# Patient Record
Sex: Female | Born: 1990 | Race: White | Hispanic: No | Marital: Single | Smoking: Current every day smoker
Health system: Southern US, Community
[De-identification: ages and names within clinical notes are randomized; demographics above are authoritative.]

## PROBLEM LIST (undated history)

## (undated) HISTORY — PX: OTHER SURGICAL HISTORY: SHX169

---

## 2013-04-10 ENCOUNTER — Ambulatory Visit: Payer: Self-pay

## 2013-04-17 ENCOUNTER — Ambulatory Visit: Payer: Self-pay | Attending: Internal Medicine | Admitting: Internal Medicine

## 2013-04-17 ENCOUNTER — Encounter: Payer: Self-pay | Admitting: Internal Medicine

## 2013-04-17 VITALS — BP 109/68 | HR 91 | Temp 98.3°F | Resp 17 | Wt 95.0 lb

## 2013-04-17 DIAGNOSIS — F17200 Nicotine dependence, unspecified, uncomplicated: Secondary | ICD-10-CM | POA: Insufficient documentation

## 2013-04-17 DIAGNOSIS — N63 Unspecified lump in unspecified breast: Secondary | ICD-10-CM | POA: Insufficient documentation

## 2013-04-17 DIAGNOSIS — M2550 Pain in unspecified joint: Secondary | ICD-10-CM | POA: Insufficient documentation

## 2013-04-17 DIAGNOSIS — Z139 Encounter for screening, unspecified: Secondary | ICD-10-CM

## 2013-04-17 DIAGNOSIS — N898 Other specified noninflammatory disorders of vagina: Secondary | ICD-10-CM

## 2013-04-17 LAB — CBC WITH DIFFERENTIAL/PLATELET
BASOS ABS: 0 10*3/uL (ref 0.0–0.1)
Basophils Relative: 1 % (ref 0–1)
EOS ABS: 0.3 10*3/uL (ref 0.0–0.7)
EOS PCT: 4 % (ref 0–5)
HCT: 40.9 % (ref 36.0–46.0)
Hemoglobin: 13.9 g/dL (ref 12.0–15.0)
LYMPHS ABS: 2.1 10*3/uL (ref 0.7–4.0)
Lymphocytes Relative: 34 % (ref 12–46)
MCH: 29 pg (ref 26.0–34.0)
MCHC: 34 g/dL (ref 30.0–36.0)
MCV: 85.4 fL (ref 78.0–100.0)
Monocytes Absolute: 0.4 10*3/uL (ref 0.1–1.0)
Monocytes Relative: 7 % (ref 3–12)
Neutro Abs: 3.4 10*3/uL (ref 1.7–7.7)
Neutrophils Relative %: 54 % (ref 43–77)
PLATELETS: 370 10*3/uL (ref 150–400)
RBC: 4.79 MIL/uL (ref 3.87–5.11)
RDW: 13.6 % (ref 11.5–15.5)
WBC: 6.2 10*3/uL (ref 4.0–10.5)

## 2013-04-17 LAB — LIPID PANEL
CHOLESTEROL: 148 mg/dL (ref 0–200)
HDL: 53 mg/dL (ref >39)
LDL Cholesterol: 82 mg/dL (ref 0–99)
Total CHOL/HDL Ratio: 2.8 Ratio
Triglycerides: 66 mg/dL (ref <150)
VLDL: 13 mg/dL (ref 0–40)

## 2013-04-17 LAB — COMPLETE METABOLIC PANEL WITH GFR
ALT: 9 U/L (ref 0–35)
AST: 13 U/L (ref 0–37)
Albumin: 4.4 g/dL (ref 3.5–5.2)
Alkaline Phosphatase: 78 U/L (ref 39–117)
BILIRUBIN TOTAL: 0.4 mg/dL (ref 0.3–1.2)
BUN: 7 mg/dL (ref 6–23)
CO2: 27 meq/L (ref 19–32)
CREATININE: 0.6 mg/dL (ref 0.50–1.10)
Calcium: 9.3 mg/dL (ref 8.4–10.5)
Chloride: 104 mEq/L (ref 96–112)
GFR, Est Non African American: >89 mL/min
Glucose, Bld: 69 mg/dL — ABNORMAL LOW (ref 70–99)
Potassium: 4.5 mEq/L (ref 3.5–5.3)
Sodium: 139 mEq/L (ref 135–145)
Total Protein: 6.9 g/dL (ref 6.0–8.3)

## 2013-04-17 MED ORDER — FLUCONAZOLE 150 MG PO TABS: 150.0000 mg | ORAL_TABLET | Freq: Once | ORAL | Status: DC

## 2013-04-17 MED ORDER — METRONIDAZOLE 500 MG PO TABS: 500.0000 mg | ORAL_TABLET | Freq: Three times a day (TID) | ORAL | Status: DC

## 2013-04-17 MED ORDER — NAPROXEN 500 MG PO TABS: 500.0000 mg | ORAL_TABLET | Freq: Two times a day (BID) | ORAL | Status: DC

## 2013-04-17 NOTE — Progress Notes (Signed)
Patient complains of lump in left breast Also had a lump in her groin on the right side which is bothersome to her

## 2013-04-17 NOTE — Progress Notes (Signed)
Patient Demographics  Judy Pham, is a 23 y.o. female  ZOX:[id number]  [id number]  DOB - Apr 26, 1990  CC:  Chief Complaint  Patient presents with  . Breast Mass       HPI: Judy Pham is a 23 y.o. female here today to establish medical care. Patient reported to have noticed lump her in her left breast for the last her 3 years as per patient it is slightly increasing in size denies any pain or any nipple discharge, denies any family history of cancer. Patient also reported to have lot of joint pain especially both knees right shoulder on and off. Patient also reported to have vaginal discharge for the last 2 weeks and questionable noticed lump in her right groin denies any rash. Patient has No headache, No chest pain, No abdominal pain - No Nausea, No new weakness tingling or numbness, No Cough - SOB.  No Known Allergies History reviewed. No pertinent past medical history. No current outpatient prescriptions on file prior to visit.   No current facility-administered medications on file prior to visit.   Family History  Problem Relation Age of Onset  . Hypertension Father   . Cancer Paternal Grandfather     skin cancer    History   Social History  . Marital Status: Single    Spouse Name: N/A    Number of Children: N/A  . Years of Education: N/A   Occupational History  . Not on file.   Social History Main Topics  . Smoking status: Light Tobacco Smoker -- 0.25 packs/day for 5 years  . Smokeless tobacco: Not on file     Comment: about 5 cigarettes a day  . Alcohol Use: No  . Drug Use: Not on file  . Sexual Activity: Not on file   Other Topics Concern  . Not on file   Social History Narrative  . No narrative on file    Review of Systems: Constitutional: Negative for fever, chills, diaphoresis, activity change, appetite change and fatigue. HENT: Negative for ear pain, nosebleeds, congestion, facial swelling, rhinorrhea, neck pain, neck stiffness and ear  discharge.  Eyes: Negative for pain, discharge, redness, itching and visual disturbance. Respiratory: Negative for cough, choking, chest tightness, shortness of breath, wheezing and stridor.  Cardiovascular: Negative for chest pain, palpitations and leg swelling. Gastrointestinal: Negative for abdominal distention. Genitourinary: Negative for dysuria, urgency, frequency, hematuria, flank pain, decreased urine volume, difficulty urinating and dyspareunia. + vaginal discharge. Musculoskeletal: Negative for back pain, joint swelling, +arthralgia  Knee pain Neurological: Negative for dizziness, tremors, seizures, syncope, facial asymmetry, speech difficulty, weakness, light-headedness, numbness and headaches.  Psychiatric/Behavioral: Negative for hallucinations, behavioral problems, confusion, dysphoric mood, decreased concentration and agitation.    Objective:   Filed Vitals:   04/17/13 1511  BP: 109/68  Pulse: 91  Temp: 98.3 F (36.8 C)  Resp: 17    Physical Exam: Constitutional: Patient appears well-developed and well-nourished. No distress. HENT: Normocephalic, atraumatic, External right and left ear normal. Oropharynx is clear and moist.  Eyes: Conjunctivae and EOM are normal. PERRLA, no scleral icterus. Neck: Normal ROM. Neck supple. No JVD. No tracheal deviation. No thyromegaly. CVS: RRR, S1/S2 +, no murmurs, no gallops, no carotid bruit.  Pulmonary: Effort and breath sounds normal, no stridor, rhonchi, wheezes, rales.  Breast examination done in the presence of medical staff as chaperone, or palpable lump in the left breast above the nipple medially, nontender. Abdominal: Soft. BS +, no distension, tenderness, rebound or guarding.  Musculoskeletal: Normal range  of motion. No edema and no tenderness.  Lymphadenopathy: No lymphadenopathy noted, in the inguinal area.  Neuro: Alert. Normal reflexes, muscle tone coordination. No cranial nerve deficit. Skin: Skin is warm and dry. No  rash noted. Not diaphoretic. No erythema. No pallor. Psychiatric: Normal mood and affect. Behavior, judgment, thought content normal.  No results found for this basename: WBC, HGB, HCT, MCV, PLT   No results found for this basename: CREATININE, BUN, NA, K, CL, CO2    No results found for this basename: HGBA1C   Lipid Panel  No results found for this basename: chol, trig, hdl, cholhdl, vldl, ldlcalc       Assessment and plan:   1. Breast lump in female  - US Breast Bilateral; Future  2. Smoking  Advised to quit smoking.  3. Joint pain  - naproxen (NAPROSYN) 500 MG tablet; Take 1 tablet (500 mg total) by mouth 2 (two) times daily with a meal.  Dispense: 30 tablet; Refill: 2  4. Vaginal discharge  - metroNIDAZOLE (FLAGYL) 500 MG tablet; Take 1 tablet (500 mg total) by mouth 3 (three) times daily.  Dispense: 21 tablet; Refill: 0 - fluconazole (DIFLUCAN) 150 MG tablet; Take 1 tablet (150 mg total) by mouth once.  Dispense: 1 tablet; Refill: 0 - Ambulatory referral to Gynecology  5. Screening  - CBC with Differential - COMPLETE METABOLIC PANEL WITH GFR - TSH - Lipid panel - Vit D  25 hydroxy (rtn osteoporosis monitoring) - Ambulatory referral to Gynecology     Health Maintenance  -Pap Smear: referral done    Return in about 6 weeks (around 05/29/2013).    Doris CheadleADVANI, Srah Ake, MD

## 2013-04-18 LAB — VITAMIN D 25 HYDROXY (VIT D DEFICIENCY, FRACTURES): Vit D, 25-Hydroxy: 21 ng/mL — ABNORMAL LOW (ref 30–89)

## 2013-04-18 LAB — TSH: TSH: 0.651 u[IU]/mL (ref 0.350–4.500)

## 2013-04-20 ENCOUNTER — Telehealth: Payer: Self-pay

## 2013-04-20 MED ORDER — VITAMIN D (ERGOCALCIFEROL) 1.25 MG (50000 UNIT) PO CAPS: 50000.0000 [IU] | ORAL_CAPSULE | ORAL | Status: DC

## 2013-04-20 NOTE — Telephone Encounter (Signed)
Message copied by Lestine MountJUAREZ, Cordae Mccarey L on Mon Apr 20, 2013  2:46 PM ------      Message from: Doris CheadleADVANI, DEEPAK      Created: Mon Apr 20, 2013 11:41 AM       Blood work reviewed, noticed low vitamin D, call patient advise to start ergocalciferol 50,000 units once a week for the duration of  12 weeks.       ------

## 2013-04-20 NOTE — Telephone Encounter (Signed)
Patient returned call Lab results given Prescription sent to [facility]

## 2013-04-20 NOTE — Telephone Encounter (Signed)
Left message on machine to return our call. 

## 2013-05-04 ENCOUNTER — Encounter (HOSPITAL_COMMUNITY): Payer: Self-pay | Admitting: Emergency Medicine

## 2013-05-04 ENCOUNTER — Emergency Department (HOSPITAL_COMMUNITY)
Admission: EM | Admit: 2013-05-04 | Discharge: 2013-05-04 | Disposition: A | Discharge status: Home / Self Care | Payer: Self-pay | Attending: Emergency Medicine | Admitting: Emergency Medicine

## 2013-05-04 DIAGNOSIS — L089 Local infection of the skin and subcutaneous tissue, unspecified: Secondary | ICD-10-CM | POA: Insufficient documentation

## 2013-05-04 DIAGNOSIS — Z79899 Other long term (current) drug therapy: Secondary | ICD-10-CM | POA: Insufficient documentation

## 2013-05-04 DIAGNOSIS — Y939 Activity, unspecified: Secondary | ICD-10-CM | POA: Insufficient documentation

## 2013-05-04 DIAGNOSIS — R61 Generalized hyperhidrosis: Secondary | ICD-10-CM | POA: Insufficient documentation

## 2013-05-04 DIAGNOSIS — F17200 Nicotine dependence, unspecified, uncomplicated: Secondary | ICD-10-CM | POA: Insufficient documentation

## 2013-05-04 DIAGNOSIS — R509 Fever, unspecified: Secondary | ICD-10-CM | POA: Insufficient documentation

## 2013-05-04 DIAGNOSIS — Y929 Unspecified place or not applicable: Secondary | ICD-10-CM | POA: Insufficient documentation

## 2013-05-04 DIAGNOSIS — Z792 Long term (current) use of antibiotics: Secondary | ICD-10-CM | POA: Insufficient documentation

## 2013-05-04 MED ORDER — HYDROCODONE-ACETAMINOPHEN 5-325 MG PO TABS: 1.0000 | ORAL_TABLET | ORAL | Status: DC | PRN

## 2013-05-04 MED ORDER — HYDROCODONE-ACETAMINOPHEN 5-325 MG PO TABS
2.0000 | ORAL_TABLET | Freq: Once | ORAL | Status: AC
  Administered 2013-05-04: 2 via ORAL
  Filled 2013-05-04: qty 2

## 2013-05-04 MED ORDER — DOXYCYCLINE HYCLATE 100 MG PO TABS
100.0000 mg | ORAL_TABLET | Freq: Once | ORAL | Status: AC
  Administered 2013-05-04: 100 mg via ORAL
  Filled 2013-05-04: qty 1

## 2013-05-04 MED ORDER — DOXYCYCLINE HYCLATE 100 MG PO CAPS: 100.0000 mg | ORAL_CAPSULE | Freq: Two times a day (BID) | ORAL | Status: DC

## 2013-05-04 NOTE — ED Provider Notes (Signed)
CSN: [id number]     Arrival date & time 05/04/13  1541 History  This chart was scribed for Emilia Beck, PA working with Gwyneth Sprout, MD by Quintella Reichert, ED Scribe. This patient was seen in room TR06C/TR06C and the patient's care was started at 5:57 PM.   Chief Complaint  Patient presents with  . Insect Bite    The history is provided by the patient. No language interpreter was used.    HPI Comments: Judy Pham is a 23 y.o. female who presents to the Emergency Department complaining of painful insect bites to bilateral legs that she first noticed 4 days ago.  Pt states that although the bites "don't look that bad" they are severely painful to even light palpation.  She also notes that several nights ago she woke up with night sweats.  She denies any other fever.  ED temperature is 98.1 F.  Pt is unsure what kind of insect bit her.  She denies being in the woods recently.  She denies recent exposure to similar symptoms.  She notes that she was recently placed on Flagyll for a separate issue and finished today.     History reviewed. No pertinent past medical history.   Past Surgical History  Procedure Laterality Date  . Right hand surgery       Family History  Problem Relation Age of Onset  . Hypertension Father   . Cancer Paternal Grandfather     skin cancer     History  Substance Use Topics  . Smoking status: Light Tobacco Smoker -- 0.25 packs/day for 5 years  . Smokeless tobacco: Not on file     Comment: about 5 cigarettes a day  . Alcohol Use: No    OB History   Grav Para Term Preterm Abortions TAB SAB Ect Mult Living                  Review of Systems  Constitutional: Negative for fever.       Night sweats  Skin: Positive for wound (insect bites).  All other systems reviewed and are negative.     Allergies  Review of patient's allergies indicates no known allergies.  Home Medications   Current Outpatient Rx  Name  Route  Sig  Dispense   Refill  . Vitamin D, Ergocalciferol, (DRISDOL) 50000 UNITS CAPS capsule   Oral   Take 1 capsule (50,000 Units total) by mouth every 7 (seven) days.   12 capsule   0   . metroNIDAZOLE (FLAGYL) 500 MG tablet   Oral   Take 1 tablet (500 mg total) by mouth 3 (three) times daily.   21 tablet   0    BP 124/75  Pulse 109  Temp(Src) 98.1 F (36.7 C) (Oral)  Resp 16  Ht 4\' 9"  (1.448 m)  Wt 89 lb (40.37 kg)  BMI 19.25 kg/m2  SpO2 96%  Physical Exam  Nursing note and vitals reviewed. Constitutional: She is oriented to person, place, and time. She appears well-developed and well-nourished. No distress.  HENT:  Head: Normocephalic and atraumatic.  Eyes: EOM are normal.  Neck: Neck supple. No tracheal deviation present.  Cardiovascular: Normal rate.   Pulmonary/Chest: Effort normal. No respiratory distress.  Musculoskeletal: Normal range of motion.  Neurological: She is alert and oriented to person, place, and time.  Skin: Skin is warm and dry.  3x3-cm area of erythema and induration, with central "bite" mark, on right lateral thigh.  Similar lesions on right lower  leg and left thigh and anterior tibia.  The areas are tender to palpate.  No open wounds.  Psychiatric: She has a normal mood and affect. Her behavior is normal.    ED Course  Procedures (including critical care time)  DIAGNOSTIC STUDIES: Oxygen Saturation is 96% on room air, normal by my interpretation.    COORDINATION OF CARE: 6:01 PM-Discussed treatment plan which includes pain medication and antibiotics for likely infection with pt at bedside and pt agreed to plan.     Labs Review Labs Reviewed - No data to display  Imaging Review No results found.  EKG Interpretation   None       MDM   Final diagnoses:  None  1. Skin infection  6:05 PM Patient likely has infected bug bites. Patient will have doxycycline and vicodin for symptoms. Patient advised to follow up with PCP for further evaluation.  Vitals stable and patient afebrile.    I personally performed the services described in this documentation, which was scribed in my presence. The recorded information has been reviewed and is accurate.     Emilia BeckKaitlyn Terell Kincy, [place] JerseyPA-C 05/04/13 1806

## 2013-05-04 NOTE — Discharge Instructions (Signed)
Take doxycycline as directed until gone. Take vicodin as needed for pain. Follow up with your doctor to make sure your symptoms are improving.

## 2013-05-04 NOTE — ED Notes (Signed)
Pt noticed insect bites to lower legs 4 days ago.  Pt has been scratching, so scabs have formed on bites.  No drainage noted.  Pt has been taking antibiotics for bv, so she thought those antibiotics would help the bites, but they have not.

## 2013-05-05 ENCOUNTER — Emergency Department (HOSPITAL_COMMUNITY)
Admission: EM | Admit: 2013-05-05 | Discharge: 2013-05-06 | Discharge status: Against Medical Advice | Payer: Self-pay | Attending: Emergency Medicine | Admitting: Emergency Medicine

## 2013-05-05 ENCOUNTER — Encounter (HOSPITAL_COMMUNITY): Payer: Self-pay | Admitting: Emergency Medicine

## 2013-05-05 DIAGNOSIS — F17200 Nicotine dependence, unspecified, uncomplicated: Secondary | ICD-10-CM | POA: Insufficient documentation

## 2013-05-05 DIAGNOSIS — Y929 Unspecified place or not applicable: Secondary | ICD-10-CM | POA: Insufficient documentation

## 2013-05-05 DIAGNOSIS — Y939 Activity, unspecified: Secondary | ICD-10-CM | POA: Insufficient documentation

## 2013-05-05 DIAGNOSIS — R109 Unspecified abdominal pain: Secondary | ICD-10-CM | POA: Insufficient documentation

## 2013-05-05 DIAGNOSIS — Z792 Long term (current) use of antibiotics: Secondary | ICD-10-CM | POA: Insufficient documentation

## 2013-05-05 DIAGNOSIS — S90569A Insect bite (nonvenomous), unspecified ankle, initial encounter: Secondary | ICD-10-CM | POA: Insufficient documentation

## 2013-05-05 DIAGNOSIS — Z3202 Encounter for pregnancy test, result negative: Secondary | ICD-10-CM | POA: Insufficient documentation

## 2013-05-05 DIAGNOSIS — W57XXXA Bitten or stung by nonvenomous insect and other nonvenomous arthropods, initial encounter: Secondary | ICD-10-CM

## 2013-05-05 DIAGNOSIS — S80869A Insect bite (nonvenomous), unspecified lower leg, initial encounter: Secondary | ICD-10-CM

## 2013-05-05 NOTE — ED Provider Notes (Signed)
Medical screening examination/treatment/procedure(s) were performed by non-physician practitioner and as supervising physician I was immediately available for consultation/collaboration.  EKG Interpretation   None         Gwyneth SproutWhitney Reade Trefz, MD 05/05/13 0040

## 2013-05-05 NOTE — ED Notes (Signed)
Pt reports spider bites to both legs a few days ago. Pt states bites are very painful and itchy. Pt worried that they might be from [facility]. Pt also started having cramping pain to lower abdomen. Pt states went to hospital yesterday and was given antibiotics.

## 2013-05-06 LAB — URINALYSIS, ROUTINE W REFLEX MICROSCOPIC
Bilirubin Urine: NEGATIVE
Glucose, UA: NEGATIVE mg/dL
Hgb urine dipstick: NEGATIVE
Ketones, ur: NEGATIVE mg/dL
NITRITE: NEGATIVE
Protein, ur: NEGATIVE mg/dL
SPECIFIC GRAVITY, URINE: 1.013 (ref 1.005–1.030)
UROBILINOGEN UA: 0.2 mg/dL (ref 0.0–1.0)
pH: 7 (ref 5.0–8.0)

## 2013-05-06 LAB — URINE MICROSCOPIC-ADD ON

## 2013-05-06 LAB — POCT PREGNANCY, URINE: PREG TEST UR: NEGATIVE

## 2013-05-06 MED ORDER — OXYCODONE-ACETAMINOPHEN 5-325 MG PO TABS: 2.0000 | ORAL_TABLET | Freq: Once | ORAL | Status: DC

## 2013-05-06 MED ORDER — TETANUS-DIPHTH-ACELL PERTUSSIS 5-2.5-18.5 LF-MCG/0.5 IM SUSP: 0.5000 mL | Freq: Once | INTRAMUSCULAR | Status: DC

## 2013-05-06 NOTE — ED Provider Notes (Signed)
CSN: [id number]     Arrival date & time 05/05/13  2210 History   First MD Initiated Contact with Patient 05/06/13 0009     Chief Complaint  Patient presents with  . Insect Bite  . Abdominal Pain     (Consider location/radiation/quality/duration/timing/severity/associated sxs/prior Treatment) HPI Comments: Patient is a 23 year old female who presents to the emergency department for suppose it spider bites to bilateral legs with onset a few days ago. Patient states that bites are painful and itchy. She states they have been worsening over time. [facility] at which time she was prescribed doxycycline. Patient states she has taken 2 doses of this medication but has seen no improvement. Patient is concerned that bites may be from a black widow spider as she began having cramping pain in her lower abdomen. Patient states that this has been constant. She has not taken anything for her pain. She denies associated fever, chest pain or shortness of breath, nausea or vomiting, dysuria or hematuria, vaginal bleeding or discharge, numbness/tingling, and weakness.  Patient is a 23 y.o. female presenting with abdominal pain. The history is provided by the patient. No language interpreter was used.  Abdominal Pain Associated symptoms: no chest pain, no fever, no nausea, no shortness of breath and no vomiting     History reviewed. No pertinent past medical history. Past Surgical History  Procedure Laterality Date  . Right hand surgery      Family History  Problem Relation Age of Onset  . Hypertension Father   . Cancer Paternal Grandfather     skin cancer    History  Substance Use Topics  . Smoking status: Light Tobacco Smoker -- 0.25 packs/day for 5 years  . Smokeless tobacco: Not on file     Comment: about 5 cigarettes a day  . Alcohol Use: No   OB History   Grav Para Term Preterm Abortions TAB SAB Ect Mult Living                 Review  of Systems  Constitutional: Negative for fever.  Respiratory: Negative for shortness of breath.   Cardiovascular: Negative for chest pain.  Gastrointestinal: Positive for abdominal pain. Negative for nausea and vomiting.  Skin: Positive for color change.  All other systems reviewed and are negative.    Allergies  Review of patient's allergies indicates no known allergies.  Home Medications   Current Outpatient Rx  Name  Route  Sig  Dispense  Refill  . doxycycline (VIBRAMYCIN) 100 MG capsule   Oral   Take 1 capsule (100 mg total) by mouth 2 (two) times daily.   20 capsule   0   . Vitamin D, Ergocalciferol, (DRISDOL) 50000 UNITS CAPS capsule   Oral   Take 1 capsule (50,000 Units total) by mouth every 7 (seven) days.   12 capsule   0    BP 124/83  Pulse 112  Temp(Src) 98.2 F (36.8 C) (Oral)  Resp 18  Ht 4\' 10"  (1.473 m)  Wt 95 lb (43.092 kg)  BMI 19.86 kg/m2  SpO2 100%  LMP 04/22/2013  Physical Exam  Nursing note and vitals reviewed. Constitutional: She is oriented to person, place, and time. She appears well-developed and well-nourished. No distress.  HENT:  Head: Normocephalic and atraumatic.  Eyes: Conjunctivae and EOM are normal. Pupils are equal, round, and reactive to light. No scleral icterus.  Neck: Normal range of motion.  Cardiovascular: Normal rate, regular  rhythm and intact distal pulses.   Pulses:      Dorsalis pedis pulses are 2+ on the right side, and 2+ on the left side.       Posterior tibial pulses are 2+ on the right side, and 2+ on the left side.  Pulmonary/Chest: Effort normal. No respiratory distress.  Abdominal: Soft. She exhibits no distension. There is no tenderness. There is no rebound and no guarding.  No peritoneal signs or masses.  Musculoskeletal: Normal range of motion.  Neurological: She is alert and oriented to person, place, and time.  No gross sensory deficits appreciated. Patient moves extremities without ataxia.  Skin: Skin  is warm and dry. She is not diaphoretic. No pallor.  3x3cm area of erythema and induration, with central "bite" mark, on right lateral thigh.  Similar lesions on right lower leg and left thigh and anterior tibia.  The areas are tender to palpation without active drainage or central fluctuance. No red linear streaking.  Psychiatric: She has a normal mood and affect. Her behavior is normal.    ED Course  Procedures (including critical care time) Labs Review Labs Reviewed  URINALYSIS, ROUTINE W REFLEX MICROSCOPIC - Abnormal; Notable for the following:    APPearance CLOUDY (*)    Leukocytes, UA LARGE (*)    All other components within normal limits  URINE MICROSCOPIC-ADD ON - Abnormal; Notable for the following:    Squamous Epithelial / LPF MANY (*)    All other components within normal limits  POCT PREGNANCY, URINE   Imaging Review No results found.  EKG Interpretation   None       MDM   Final diagnoses:  Insect bite of lower extremity  Abdominal cramping   23 year old female presents to the emergency department for the second time in 24 hours regarding "spider bites" on her bilateral legs. Patient is neurovascularly intact on physical exam. No gross sensory deficits appreciated. Patient ambulatory with normal gait. She does have 4 bite marks noted, 2 on each lower extremity. No red linear streaking appreciated. No area of fluctuance or active purulent drainage or weeping.   Patient is well and nontoxic appearing, hemodynamically stable, and afebrile today. She has only taken 2 doses of her doxycycline. I have explained to the patient that it will take a bit more time to gauge improvement after initiating course of abx. She also states that she is concerned that her bites or from a black widow spider. I explained to her that there is no blood test that we are able to run to determine whether or not a black widow spider caused the bites on her bilateral legs. I have iterated the  importance of managing her symptoms with oral abx and warm compresses. Have recommended ibuprofen for pain control; patient has not taken anything for pain prior to arrival. Abdominal exam today is benign without peritoneal signs or masses. Urinalysis does not suggest infection. Do not believe emergent abdominopelvic imaging is indicated at this time.  I have recommended that patient have her tetanus updated in ED. Patient eloped from the ED prior to receiving this. She will be signed out AMA.    Antony Madura, PA-C 05/08/13 [phone]

## 2013-05-08 NOTE — ED Provider Notes (Signed)
Medical screening examination/treatment/procedure(s) were performed by non-physician practitioner and as supervising physician I was immediately available for consultation/collaboration.   Jailyn Langhorst, MD 05/08/13 0707 

## 2013-05-22 ENCOUNTER — Other Ambulatory Visit (HOSPITAL_COMMUNITY): Payer: Self-pay | Admitting: *Deleted

## 2013-05-22 DIAGNOSIS — N63 Unspecified lump in unspecified breast: Secondary | ICD-10-CM

## 2013-05-26 ENCOUNTER — Other Ambulatory Visit: Payer: Self-pay | Admitting: Obstetrics and Gynecology

## 2013-05-26 ENCOUNTER — Encounter (HOSPITAL_COMMUNITY): Payer: Self-pay

## 2013-05-26 ENCOUNTER — Encounter (INDEPENDENT_AMBULATORY_CARE_PROVIDER_SITE_OTHER): Payer: Self-pay

## 2013-05-26 ENCOUNTER — Ambulatory Visit (HOSPITAL_COMMUNITY)
Admission: RE | Admit: 2013-05-26 | Discharge: 2013-05-26 | Disposition: A | Discharge status: Home / Self Care | Payer: Self-pay | Source: Ambulatory Visit | Attending: Obstetrics and Gynecology | Admitting: Obstetrics and Gynecology

## 2013-05-26 VITALS — BP 102/60 | Temp 99.0°F | Ht <= 58 in | Wt 90.6 lb

## 2013-05-26 DIAGNOSIS — N63 Unspecified lump in unspecified breast: Secondary | ICD-10-CM

## 2013-05-26 DIAGNOSIS — Z01419 Encounter for gynecological examination (general) (routine) without abnormal findings: Secondary | ICD-10-CM

## 2013-05-26 NOTE — Progress Notes (Signed)
Complaints of left breast lump x 2 years that patient states has increased in size.  Pap Smear:  Pap smear completed today. [place] per patient. Per patient has no history of an abnormal Pap smear. No Pap smear results in EPIC.  Physical exam: Breasts Breasts symmetrical. No skin abnormalities bilateral breasts. No nipple retraction bilateral breasts. No nipple discharge bilateral breasts. No lymphadenopathy. No lumps palpated right breast. Palpated a moveable lump within the left breast at 12 o'clock above the areola. No complaints of pain or tenderness on exam. [facility]. Appointment scheduled for Monday, June 01, 2013 at 0915.        Pelvic/Bimanual   Ext Genitalia No lesions, no swelling and no discharge observed on external genitalia.         Vagina Vagina pink and normal texture. No lesions and thick white cottage cheese appearing discharge observed in vagina. Wet prep completed.         Cervix Cervix is present. Cervix pink and of normal texture. Thick white cottage cheese appearing discharge observed on cervix.     Uterus Uterus is present and palpable. Uterus in normal position and normal size.        Adnexae Bilateral ovaries present and palpable. No tenderness on palpation.          Rectovaginal No rectal exam completed today since patient had no rectal complaints. No skin abnormalities observed on exam.

## 2013-05-26 NOTE — Patient Instructions (Signed)
Taught Judy LeitzSandi Pham how to perform BSE and gave educational materials to take home. Let her know [facility] will cover Pap smears every 3 years unless has a history of abnormal Pap smears. [facility]. Appointment scheduled for Monday, June 01, 2013 at 0915. Patient aware of appointment and will be there. Let patient know will follow up with her within the next couple weeks with results of Pap smear by phone. Judy LeitzSandi Pham verbalized understanding.  Gracelynne Benedict, Kathaleen Maserhristine Poll, RN 11:59 AM

## 2013-05-27 ENCOUNTER — Telehealth (HOSPITAL_COMMUNITY): Payer: Self-pay | Admitting: *Deleted

## 2013-05-27 ENCOUNTER — Other Ambulatory Visit (HOSPITAL_COMMUNITY): Payer: Self-pay | Admitting: *Deleted

## 2013-05-27 DIAGNOSIS — B9689 Other specified bacterial agents as the cause of diseases classified elsewhere: Secondary | ICD-10-CM

## 2013-05-27 DIAGNOSIS — N760 Acute vaginitis: Principal | ICD-10-CM

## 2013-05-27 LAB — WET PREP, GENITAL
Trich, Wet Prep: NONE SEEN
Yeast Wet Prep HPF POC: NONE SEEN

## 2013-05-27 MED ORDER — METRONIDAZOLE 500 MG PO TABS: 500.0000 mg | ORAL_TABLET | Freq: Two times a day (BID) | ORAL | Status: DC

## 2013-05-27 NOTE — Telephone Encounter (Signed)
Telephoned patient at home # and left message to return call to [facility] 

## 2013-05-27 NOTE — Telephone Encounter (Signed)
Patient returned call and advised patient of wet prep results. Advised patient of bacterial vaginosis and med called in to pharmacy. Patient voiced understanding.

## 2013-05-29 ENCOUNTER — Ambulatory Visit: Payer: Self-pay | Admitting: Internal Medicine

## 2013-06-01 ENCOUNTER — Ambulatory Visit
Admission: RE | Admit: 2013-06-01 | Discharge: 2013-06-01 | Disposition: A | Discharge status: Home / Self Care | Payer: No Typology Code available for payment source | Source: Ambulatory Visit | Attending: Obstetrics and Gynecology | Admitting: Obstetrics and Gynecology

## 2013-06-01 DIAGNOSIS — N63 Unspecified lump in unspecified breast: Secondary | ICD-10-CM

## 2013-06-24 ENCOUNTER — Telehealth (HOSPITAL_COMMUNITY): Payer: Self-pay | Admitting: *Deleted

## 2013-06-24 NOTE — Telephone Encounter (Signed)
Telephoned patient at home # and left message to return call to [facility] 

## 2013-06-29 ENCOUNTER — Encounter (HOSPITAL_COMMUNITY): Payer: Self-pay | Admitting: Emergency Medicine

## 2013-06-29 ENCOUNTER — Encounter: Payer: Self-pay | Admitting: Family Medicine

## 2013-06-29 ENCOUNTER — Emergency Department (HOSPITAL_COMMUNITY)
Admission: EM | Admit: 2013-06-29 | Discharge: 2013-06-29 | Disposition: A | Discharge status: Home / Self Care | Payer: Self-pay | Attending: Emergency Medicine | Admitting: Emergency Medicine

## 2013-06-29 DIAGNOSIS — F17200 Nicotine dependence, unspecified, uncomplicated: Secondary | ICD-10-CM | POA: Insufficient documentation

## 2013-06-29 DIAGNOSIS — J029 Acute pharyngitis, unspecified: Secondary | ICD-10-CM | POA: Insufficient documentation

## 2013-06-29 DIAGNOSIS — H60399 Other infective otitis externa, unspecified ear: Secondary | ICD-10-CM | POA: Insufficient documentation

## 2013-06-29 DIAGNOSIS — H6091 Unspecified otitis externa, right ear: Secondary | ICD-10-CM

## 2013-06-29 DIAGNOSIS — Z792 Long term (current) use of antibiotics: Secondary | ICD-10-CM | POA: Insufficient documentation

## 2013-06-29 DIAGNOSIS — R110 Nausea: Secondary | ICD-10-CM | POA: Insufficient documentation

## 2013-06-29 DIAGNOSIS — L089 Local infection of the skin and subcutaneous tissue, unspecified: Secondary | ICD-10-CM | POA: Insufficient documentation

## 2013-06-29 MED ORDER — NEOMYCIN-POLYMYXIN-HC 3.5-10000-1 OT SUSP: 4.0000 [drp] | Freq: Four times a day (QID) | OTIC | Status: DC

## 2013-06-29 MED ORDER — ANTIPYRINE-BENZOCAINE 5.4-1.4 % OT SOLN
3.0000 [drp] | Freq: Once | OTIC | Status: AC
  Administered 2013-06-29: 4 [drp] via OTIC
  Filled 2013-06-29: qty 10

## 2013-06-29 MED ORDER — HYDROCODONE-ACETAMINOPHEN 5-325 MG PO TABS: 1.0000 | ORAL_TABLET | ORAL | Status: DC | PRN

## 2013-06-29 MED ORDER — SULFAMETHOXAZOLE-TRIMETHOPRIM 800-160 MG PO TABS: 1.0000 | ORAL_TABLET | Freq: Two times a day (BID) | ORAL | Status: DC

## 2013-06-29 NOTE — ED Notes (Signed)
Rt ear pain x 2 days hurts to eat pain went away and then came back

## 2013-06-29 NOTE — ED Provider Notes (Signed)
CSN: [id number]     Arrival date & time 06/29/13  1221 History  This chart was scribed for non-physician practitioner, Trixie DredgeEmily Sukhraj Esquivias, PA-C working with Lyanne CoKevin M Campos, MD by Greggory StallionKayla Andersen, ED scribe. This patient was seen in room TR06C/TR06C and the patient's care was started at 3:18 PM.   Chief Complaint  Patient presents with  . Otalgia   The history is provided by the patient. No language interpreter was used.   HPI Comments: Judy LeitzSandi Pham is a 23 y.o. female who presents to the Emergency Department complaining of gradual onset, constant, stabbing right ear pain that started 2 days ago. Pt states it started as jaw pain radiating into her ear. She has ear discharge at night, nausea, congestion, and sore throat. Pt has tried a heating pad and taking aspirin with no relief. She got her right tragus pierced two weeks ago. Denies fever, emesis.   History reviewed. No pertinent past medical history. Past Surgical History  Procedure Laterality Date  . Right hand surgery     . Right wrist surgery     Family History  Problem Relation Age of Onset  . Hypertension Father   . Cancer Paternal Grandfather     skin cancer    History  Substance Use Topics  . Smoking status: Current Every Day Smoker -- 0.50 packs/day for 5 years    Types: Cigarettes  . Smokeless tobacco: Never Used     Comment: about 5 cigarettes a day  . Alcohol Use: No   OB History   Grav Para Term Preterm Abortions TAB SAB Ect Mult Living   0              Review of Systems  HENT: Positive for congestion, ear discharge, ear pain and sore throat.   Gastrointestinal: Positive for nausea. Negative for vomiting.  All other systems reviewed and are negative.   Allergies  Review of patient's allergies indicates no known allergies.  Home Medications   Current Outpatient Rx  Name  Route  Sig  Dispense  Refill  . doxycycline (VIBRAMYCIN) 100 MG capsule   Oral   Take 1 capsule (100 mg total) by mouth 2 (two) times daily.  20 capsule   0   . metroNIDAZOLE (FLAGYL) 500 MG tablet   Oral   Take 1 tablet (500 mg total) by mouth 2 (two) times daily.   14 tablet   0   . Vitamin D, Ergocalciferol, (DRISDOL) 50000 UNITS CAPS capsule   Oral   Take 1 capsule (50,000 Units total) by mouth every 7 (seven) days.   12 capsule   0    BP 113/72  Pulse 99  Temp(Src) 97.9 F (36.6 C) (Oral)  Resp 18  SpO2 100%  Physical Exam  Nursing note and vitals reviewed. Constitutional: She appears well-developed and well-nourished. No distress.  HENT:  Head: Normocephalic and atraumatic.  Left Ear: Tympanic membrane and ear canal normal.  Mouth/Throat: No dental caries. Posterior oropharyngeal erythema present. No oropharyngeal exudate or posterior oropharyngeal edema.  Right canal is edematous and erythematous. Pain with traction of the ear. Piercing in right tragus that is edematous with purulent discharge.   Neck: Neck supple.  Pulmonary/Chest: Effort normal.  Neurological: She is alert.  Skin: She is not diaphoretic.    ED Course  Procedures (including critical care time)  DIAGNOSTIC STUDIES: Oxygen Saturation is 100% on RA, normal by my interpretation.    COORDINATION OF CARE: 3:21 PM-Discussed treatment plan which includes antibiotic  ear drops and pain drops with pt at bedside and pt agreed to plan. Advised pt to take her tragal piercing out.  Labs Review Labs Reviewed - No data to display Imaging Review No results found.   EKG Interpretation None      MDM   Final diagnoses:  Right otitis externa  Skin infection    Afebrile nontoxic patient with infection of right tragus piercing with associated otitis externa.  Pt advised piercing needs to come out immediately.  I offered and nurse offered to take the piercing out and patient declined stating she wanted her mother to take it out.  Pt d/c home with cortisporin otic, bactrim, norco. Discussed result, findings, treatment, and follow up  with  patient.  Pt given return precautions.  Pt verbalizes understanding and agrees with plan.      I personally performed the services described in this documentation, which was scribed in my presence. The recorded information has been reviewed and is accurate.  Trixie Dredge, PA-C 06/29/13 1555

## 2013-06-29 NOTE — Discharge Instructions (Signed)
Read the information below.  Use the prescribed medication as directed.  Please discuss all new medications with your pharmacist.  Do not take additional tylenol while taking the prescribed pain medication to avoid overdose.  You may return to the Emergency Department at any time for worsening condition or any new symptoms that concern you.  If there is any possibility that you might be pregnant, please let your health care provider know and discuss this with the pharmacist to ensure medication safety.    It is very important that you remove the piercing in your right tragus immediately.  If you develop high fevers, uncontrolled pain, redness or swelling of your ear, return to the ER immediately for a recheck.

## 2013-06-30 NOTE — ED Provider Notes (Signed)
Medical screening examination/treatment/procedure(s) were performed by non-physician practitioner and as supervising physician I was immediately available for consultation/collaboration.   EKG Interpretation None        Earsel Shouse M Nohelani Benning, MD 06/30/13 0740 

## 2013-07-17 ENCOUNTER — Telehealth (HOSPITAL_COMMUNITY): Payer: Self-pay | Admitting: *Deleted

## 2013-07-17 NOTE — Telephone Encounter (Signed)
Telephoned patient at home # and states this is wrong # and does not know patient.

## 2013-11-24 ENCOUNTER — Encounter (HOSPITAL_COMMUNITY): Payer: Self-pay | Admitting: Emergency Medicine

## 2013-11-24 ENCOUNTER — Emergency Department (HOSPITAL_COMMUNITY)
Admission: EM | Admit: 2013-11-24 | Discharge: 2013-11-24 | Disposition: A | Discharge status: Home / Self Care | Payer: Self-pay | Attending: Emergency Medicine | Admitting: Emergency Medicine

## 2013-11-24 DIAGNOSIS — F17200 Nicotine dependence, unspecified, uncomplicated: Secondary | ICD-10-CM | POA: Insufficient documentation

## 2013-11-24 DIAGNOSIS — Z79899 Other long term (current) drug therapy: Secondary | ICD-10-CM | POA: Insufficient documentation

## 2013-11-24 DIAGNOSIS — K089 Disorder of teeth and supporting structures, unspecified: Secondary | ICD-10-CM | POA: Insufficient documentation

## 2013-11-24 DIAGNOSIS — K0889 Other specified disorders of teeth and supporting structures: Secondary | ICD-10-CM

## 2013-11-24 MED ORDER — NAPROXEN 500 MG PO TABS: 500.0000 mg | ORAL_TABLET | Freq: Two times a day (BID) | ORAL | Status: DC

## 2013-11-24 MED ORDER — NAPROXEN 250 MG PO TABS
375.0000 mg | ORAL_TABLET | Freq: Once | ORAL | Status: AC
  Administered 2013-11-24: 375 mg via ORAL
  Filled 2013-11-24: qty 2

## 2013-11-24 MED ORDER — PENICILLIN V POTASSIUM 500 MG PO TABS: 500.0000 mg | ORAL_TABLET | Freq: Three times a day (TID) | ORAL | Status: DC

## 2013-11-24 NOTE — ED Notes (Signed)
Pt to ED c/o tooth pain; reports having a filling in the R upper molar that came out about 1 month; minimal swelling noted to R side of face

## 2013-11-24 NOTE — ED Provider Notes (Signed)
CSN: [id number]     Arrival date & time 11/24/13  1921 History  This chart was scribed for non-physician practitioner, Fayrene Helper, PA-C working with Doug Sou, MD by Luisa Dago, ED scribe. This patient was seen in room TR10C/TR10C and the patient's care was started at 7:59 PM.    Chief Complaint  Patient presents with  . Dental Pain   The history is provided by the patient. No language interpreter was used.   HPI Comments: Judy Pham is a 23 y.o. female who presents to the Emergency Department complaining of acute onset dental pain that started today PTA. Pt states that one month ago she had a cavity filled and today while she was drinking some juice she experienced sudden pain to the affected tooth. Ms. Dorough states that the pain radiates to the right side of her face. Pt states that the pain is worsened by warm/cold liquids. She reports taking 3 Asprins with no relief. Pt denies any fever or chills. Pt is a smoker.  History reviewed. No pertinent past medical history. Past Surgical History  Procedure Laterality Date  . Right hand surgery     . Right wrist surgery     Family History  Problem Relation Age of Onset  . Hypertension Father   . Cancer Paternal Grandfather     skin cancer    History  Substance Use Topics  . Smoking status: Current Every Day Smoker -- 0.50 packs/day for 5 years    Types: Cigarettes  . Smokeless tobacco: Never Used     Comment: about 5 cigarettes a day  . Alcohol Use: No   OB History   Grav Para Term Preterm Abortions TAB SAB Ect Mult Living   0              Review of Systems  Constitutional: Negative for fever and chills.  HENT: Positive for dental problem.   Respiratory: Negative for chest tightness and shortness of breath.   Cardiovascular: Negative for chest pain.  Gastrointestinal: Negative for nausea, vomiting and abdominal pain.  Neurological: Negative for dizziness, syncope, light-headedness and headaches.   Allergies  Review  of patient's allergies indicates no known allergies.  Home Medications   Prior to Admission medications   Medication Sig Start Date End Date Taking? Authorizing Provider  aspirin 325 MG tablet Take 650 mg by mouth every 4 (four) hours as needed for mild pain.   Yes Historical Provider, MD  Cholecalciferol (VITAMIN D PO) Take 1 tablet by mouth daily.   Yes Historical Provider, MD   BP 133/87  Pulse 115  Temp(Src) 97.7 F (36.5 C) (Oral)  Resp 16  SpO2 100%  LMP 10/27/2013  Physical Exam  Nursing note and vitals reviewed. Constitutional: She is oriented to person, place, and time. She appears well-developed and well-nourished. No distress.  HENT:  Head: Normocephalic and atraumatic.  Right Ear: External ear normal.  Left Ear: External ear normal.  Nose: Nose normal.  Mouth/Throat: Oropharynx is clear and moist. No oropharyngeal exudate.  Filling noted to tooth # 2 with tenderness upon palpation. No obvious dental decay or abscess. No gingival erythema.   Eyes: Conjunctivae and EOM are normal. Pupils are equal, round, and reactive to light.  Neck: Normal range of motion. Neck supple.  Cardiovascular: Normal rate.   Pulmonary/Chest: Effort normal. No respiratory distress.  Musculoskeletal: Normal range of motion.  Neurological: She is alert and oriented to person, place, and time.  Skin: Skin is warm and dry.  Psychiatric: She has a normal mood and affect. Her behavior is normal.    ED Course  Procedures (including critical care time)  DIAGNOSTIC STUDIES: Oxygen Saturation is 100% on RA, normal by my interpretation.    COORDINATION OF CARE: 8:06 PM- Pt is requesting a referral to a dentist. Will do as requested and give pt a referral to dentist on call. Smoking cessation was discussed with pt. Pt advised of plan for treatment and pt agrees.  Labs Review Labs Reviewed - No data to display  Imaging Review No results found.   EKG Interpretation None      MDM    Final diagnoses:  Pain, dental    BP 117/75  Pulse 98  Temp(Src) 97.7 F (36.5 C) (Oral)  Resp 16  SpO2 100%  LMP 10/27/2013   I personally performed the services described in this documentation, which was scribed in my presence. The recorded information has been reviewed and is accurate.    Fayrene Helper, PA-C 11/28/13 [phone]

## 2013-11-24 NOTE — Discharge Instructions (Signed)

## 2013-11-28 NOTE — ED Provider Notes (Signed)
Medical screening examination/treatment/procedure(s) were performed by non-physician practitioner and as supervising physician I was immediately available for consultation/collaboration.   EKG Interpretation None       Emmett Bracknell, MD 11/28/13 1518 

## 2014-01-06 ENCOUNTER — Encounter (HOSPITAL_COMMUNITY): Payer: Self-pay | Admitting: Emergency Medicine

## 2014-01-06 ENCOUNTER — Ambulatory Visit (HOSPITAL_COMMUNITY): Payer: Self-pay | Attending: Emergency Medicine

## 2014-01-06 ENCOUNTER — Emergency Department (INDEPENDENT_AMBULATORY_CARE_PROVIDER_SITE_OTHER)
Admission: EM | Admit: 2014-01-06 | Discharge: 2014-01-06 | Disposition: A | Discharge status: Home / Self Care | Payer: Self-pay | Source: Home / Self Care | Attending: Emergency Medicine | Admitting: Emergency Medicine

## 2014-01-06 DIAGNOSIS — R062 Wheezing: Secondary | ICD-10-CM | POA: Insufficient documentation

## 2014-01-06 DIAGNOSIS — R05 Cough: Secondary | ICD-10-CM | POA: Insufficient documentation

## 2014-01-06 DIAGNOSIS — J4521 Mild intermittent asthma with (acute) exacerbation: Secondary | ICD-10-CM

## 2014-01-06 MED ORDER — IPRATROPIUM-ALBUTEROL 0.5-2.5 (3) MG/3ML IN SOLN
RESPIRATORY_TRACT | Status: AC
  Filled 2014-01-06: qty 3

## 2014-01-06 MED ORDER — IPRATROPIUM-ALBUTEROL 0.5-2.5 (3) MG/3ML IN SOLN
3.0000 mL | Freq: Once | RESPIRATORY_TRACT | Status: AC
  Administered 2014-01-06: 3 mL via RESPIRATORY_TRACT

## 2014-01-06 MED ORDER — ALBUTEROL SULFATE (2.5 MG/3ML) 0.083% IN NEBU
5.0000 mg | INHALATION_SOLUTION | Freq: Once | RESPIRATORY_TRACT | Status: AC
  Administered 2014-01-06: 5 mg via RESPIRATORY_TRACT

## 2014-01-06 MED ORDER — TRAMADOL HCL 50 MG PO TABS: 100.0000 mg | ORAL_TABLET | Freq: Three times a day (TID) | ORAL | Status: DC | PRN

## 2014-01-06 MED ORDER — ALBUTEROL SULFATE (2.5 MG/3ML) 0.083% IN NEBU
INHALATION_SOLUTION | RESPIRATORY_TRACT | Status: AC
  Filled 2014-01-06: qty 3

## 2014-01-06 MED ORDER — PREDNISONE 20 MG PO TABS: ORAL_TABLET | ORAL | Status: DC

## 2014-01-06 MED ORDER — PREDNISONE 20 MG PO TABS
ORAL_TABLET | ORAL | Status: AC
  Filled 2014-01-06: qty 3

## 2014-01-06 MED ORDER — PREDNISONE 20 MG PO TABS
60.0000 mg | ORAL_TABLET | Freq: Once | ORAL | Status: AC
  Administered 2014-01-06: 60 mg via ORAL

## 2014-01-06 MED ORDER — GUAIFENESIN-CODEINE 100-10 MG/5ML PO SYRP: 5.0000 mL | ORAL_SOLUTION | Freq: Three times a day (TID) | ORAL | Status: DC | PRN

## 2014-01-06 MED ORDER — ALBUTEROL SULFATE HFA 108 (90 BASE) MCG/ACT IN AERS: 2.0000 | INHALATION_SPRAY | Freq: Four times a day (QID) | RESPIRATORY_TRACT | Status: DC

## 2014-01-06 NOTE — ED Notes (Addendum)
Pt  Reports     Symptoms  Of  Cough   /  Congested        Tightness in  Chest  With  Body  Aches   And  Nasal  stuffyness  X  2  Weeks  Pt  Stopped  Smoking  3  Weeks  ago       She  denys  Any  History  Of  Asthma

## 2014-01-06 NOTE — ED Provider Notes (Signed)
Chief Complaint   URI   History of Present Illness   Judy LeitzSandi Pham is a 23 year old female who has had a two-week history of dry cough, chest tightness, wheezing, chest pain, difficulty breathing, nausea, vomiting, diarrhea, abdominal pain, fever up to 100, rhinorrhea, and headache. She denies any sick exposure. No history of asthma. She quit smoking cigarettes 3 weeks ago.  Review of Systems   Other than as noted above, the patient denies any of the following symptoms: Systemic:  No fevers, chills, sweats, or myalgias. Eye:  No redness or discharge. ENT:  No ear pain, headache, nasal congestion, drainage, sinus pressure, or sore throat. Neck:  No neck pain, stiffness, or swollen glands. Lungs:  No cough, sputum production, hemoptysis, wheezing, chest tightness, shortness of breath or chest pain. GI:  No abdominal pain, nausea, vomiting or diarrhea.  PMFSH   Past medical history, family history, social history, meds, and allergies were reviewed.   Physical exam   Vital signs:  BP 128/79  Pulse 101  Temp(Src) 97.7 F (36.5 C) (Oral)  Resp 16  SpO2 99%  LMP 12/19/2013 General:  Alert and oriented.  In no distress.  Skin warm and dry. She is coughing frequently and has audible wheezes. Eye:  No conjunctival injection or drainage. Lids were normal. ENT:  TMs and canals were normal, without erythema or inflammation.  Nasal mucosa was clear and uncongested, without drainage.  Mucous membranes were moist.  Pharynx was clear with no exudate or drainage.  There were no oral ulcerations or lesions. Neck:  Supple, no adenopathy, tenderness or mass. Lungs:  No respiratory distress no retractions or use of accessory muscles.  She has bilateral expiratory wheezes, rales and rhonchi, good air movement bilaterally.  Heart:  Regular rhythm, without gallops, murmers or rubs. Skin:  Clear, warm, and dry, without rash or lesions.  Radiology   Dg Chest 2 View  01/06/2014   CLINICAL DATA:   Cough and wheezing for 2 weeks.  EXAM: CHEST  2 VIEW  COMPARISON:  None.  FINDINGS: The heart size and mediastinal contours are within normal limits. Both lungs are clear. The visualized skeletal structures are unremarkable.  IMPRESSION: No active cardiopulmonary disease.   Electronically Signed   By: Maisie Fushomas  Register   On: 01/06/2014 14:23     Course in [facility]   The following medications were given:  Medications  albuterol (PROVENTIL) (2.5 MG/3ML) 0.083% nebulizer solution 5 mg (5 mg Nebulization Given 01/06/14 1333)  ipratropium-albuterol (DUONEB) 0.5-2.5 (3) MG/3ML nebulizer solution 3 mL (3 mLs Nebulization Given 01/06/14 1333)  predniSONE (DELTASONE) tablet 60 mg (60 mg Oral Given 01/06/14 1333)   She felt better after the breathing treatment, and had fewer wheezes and rales and rhonchi on auscultation.  Assessment     The encounter diagnosis was Asthmatic bronchitis, mild intermittent, with acute exacerbation.  Plan    1.  Meds:  The following meds were prescribed:   New Prescriptions   ALBUTEROL (PROVENTIL HFA;VENTOLIN HFA) 108 (90 BASE) MCG/ACT INHALER    Inhale 2 puffs into the lungs 4 (four) times daily.   GUAIFENESIN-CODEINE (ROBITUSSIN AC) 100-10 MG/5ML SYRUP    Take 5 mLs by mouth 3 (three) times daily as needed for cough.   PREDNISONE (DELTASONE) 20 MG TABLET    Take 3 daily for 5 days, 2 daily for 5 days, 1 daily for 5 days.   TRAMADOL (ULTRAM) 50 MG TABLET    Take 2 tablets (100 mg total) by mouth  every 8 (eight) hours as needed.    2.  Patient Education/Counseling:  The patient was given appropriate handouts, self care instructions, and instructed in symptomatic relief.  Instructed to get extra fluids and extra rest.    3.  Follow up:  The patient was told to follow up here if no better in 3 to 4 days, or sooner if becoming worse in any way, and given some red flag symptoms such as increasing fever, difficulty breathing, chest pain, or persistent vomiting  which would prompt immediate return.       Reuben Likesavid C Leeum Sankey, MD 01/06/14 [phone]

## 2014-01-06 NOTE — Discharge Instructions (Signed)
Bronchospasm °A bronchospasm is a spasm or tightening of the airways going into the lungs. During a bronchospasm breathing becomes more difficult because the airways get smaller. When this happens there can be coughing, a whistling sound when breathing (wheezing), and difficulty breathing. Bronchospasm is often associated with asthma, but not all patients who experience a bronchospasm have asthma. °CAUSES  °A bronchospasm is caused by inflammation or irritation of the airways. The inflammation or irritation may be triggered by:  °· Allergies (such as to animals, pollen, food, or mold). Allergens that cause bronchospasm may cause wheezing immediately after exposure or many hours later.   °· Infection. Viral infections are believed to be the most common cause of bronchospasm.   °· Exercise.   °· Irritants (such as pollution, cigarette smoke, strong odors, aerosol sprays, and paint fumes).   °· Weather changes. Winds increase molds and pollens in the air. Rain refreshes the air by washing irritants out. Cold air may cause inflammation.   °· Stress and emotional upset.   °SIGNS AND SYMPTOMS  °· Wheezing.   °· Excessive nighttime coughing.   °· Frequent or severe coughing with a simple cold.   °· Chest tightness.   °· Shortness of breath.   °DIAGNOSIS  °Bronchospasm is usually diagnosed through a history and physical exam. Tests, such as chest X-rays, are sometimes done to look for other conditions. °TREATMENT  °· Inhaled medicines can be given to open up your airways and help you breathe. The medicines can be given using either an inhaler or a nebulizer machine. °· Corticosteroid medicines may be given for severe bronchospasm, usually when it is associated with asthma. °HOME CARE INSTRUCTIONS  °· Always have a plan prepared for seeking medical care. Know when to call your health care provider and local emergency services (911 in the [place] Know where you can access local emergency care. °· Only take medicines as  directed by your health care provider. °· If you were prescribed an inhaler or nebulizer machine, ask your health care provider to explain how to use it correctly. Always use a spacer with your inhaler if you were given one. °· It is necessary to remain calm during an attack. Try to relax and breathe more slowly.  °· Control your home environment in the following ways:   °¨ Change your heating and air conditioning filter at least once a month.   °¨ Limit your use of fireplaces and wood stoves. °¨ Do not smoke and do not allow smoking in your home.   °¨ Avoid exposure to perfumes and fragrances.   °¨ Get rid of pests (such as roaches and mice) and their droppings.   °¨ Throw away plants if you see mold on them.   °¨ Keep your house clean and dust free.   °¨ Replace carpet with wood, tile, or vinyl flooring. Carpet can trap dander and dust.   °¨ Use allergy-proof pillows, mattress covers, and box spring covers.   °¨ Wash bed sheets and blankets every week in hot water and dry them in a dryer.   °¨ Use blankets that are made of polyester or cotton.   °¨ Wash hands frequently. °SEEK MEDICAL CARE IF:  °· You have muscle aches.   °· You have chest pain.   °· The sputum changes from clear or white to yellow, green, gray, or bloody.   °· The sputum you cough up gets thicker.   °· There are problems that may be related to the medicine you are given, such as a rash, itching, swelling, or trouble breathing.   °SEEK IMMEDIATE MEDICAL CARE IF:  °· You have worsening wheezing and coughing even   after taking your prescribed medicines.   You have increased difficulty breathing.   You develop severe chest pain. MAKE SURE YOU:   Understand these instructions.  Will watch your condition.  Will get help right away if you are not doing well or get worse. Document Released: 03/15/2003 Document Revised: 03/17/2013 Document Reviewed: 09/01/2012 [facility] Patient Information 2015 [place], [place]. This information is not  intended to replace advice given to you by your health care provider. Make sure you discuss any questions you have with your health care provider.  Most upper respiratory infections are caused by viruses and do not require antibiotics.  We try to save the antibiotics for when we really need them to prevent bacteria from developing resistance to them.  Here are a few hints about things that can be done at home to help get over an upper respiratory infection quicker:  Get extra sleep and extra fluids.  Get 7 to 9 hours of sleep per night and 6 to 8 glasses of water a day.  Getting extra sleep keeps the immune system from getting run down.  Most people with an upper respiratory infection are a little dehydrated.  The extra fluids also keep the secretions liquified and easier to deal with.  Also, get extra vitamin C.  4000 mg per day is the recommended dose. For the aches, headache, and fever, acetaminophen or ibuprofen are helpful.  These can be alternated every 4 hours.  People with liver disease should avoid large amounts of acetaminophen, and people with ulcer disease, gastroesophageal reflux, gastritis, congestive heart failure, chronic kidney disease, coronary artery disease and the elderly should avoid ibuprofen. For nasal congestion try Mucinex-D, or if you're having lots of sneezing or clear nasal drainage use Zyrtec-D. People with high blood pressure can take these if their blood pressure is controlled, if not, it's best to avoid the forms with a "D" (decongestants).  You can use the plain Mucinex, Allegra, Claritin, or Zyrtec even if your blood pressure is not controlled.   A Saline nasal spray such as Ocean Spray can also help.  You can add a decongestant sprays such as Afrin, but you should not use the decongestant sprays for more than 3 or 4 days since they can be habituating.  Breathe Rite nasal strips can also offer a non-drug alternative treatment to nasal congestion, especially at night. For  people with symptoms of sinusitis, sleeping with your head elevated can be helpful.  For sinus pain, moist, hot compresses to the face may provide some relief.  Many people find that inhaling steam as in a shower or from a pot of steaming water can help. For any viral infection, zinc containing lozenges such as Cold-Eze or Zicam are helpful.  Zinc helps to fight viral infection.  Hot salt water gargles (8 oz of hot water, 1/2 tsp of table salt, and a pinch of baking soda) can give relief as well as hot beverages such as hot tea.  Sucrets extra strength lozenges will help the sore throat.  For the cough, take Delsym 2 tsp every 12 hours.  It has also been found recently that Aleve can help control a cough.  The dose is 1 to 2 tablets twice daily with food.  This can be combined with Delsym. (Note, if you are taking ibuprofen, you should not take Aleve as well--take one or the other.) A cool mist vaporizer will help keep your mucous membranes from drying out.   It's important when you have  an upper respiratory infection not to pass the infection to others.  This involves being very careful about the following:  Frequent hand washing or use of hand sanitizer, especially after coughing, sneezing, blowing your nose or touching your face, nose or eyes. Do not shake hands or touch anyone and try to avoid touching surfaces that other people use such as doorknobs, shopping carts, telephones and computer keyboards. Use tissues and dispose of them properly in a garbage can or ziplock bag. Cough into your sleeve. Do not let others eat or drink after you.  It's also important to recognize the signs of serious illness and get evaluated if they occur: Any respiratory infection that lasts more than 7 to 10 days.  Yellow nasal drainage and sputum are not reliable indicators of a bacterial infection, but if they last for more than 1 week, see your doctor. Fever and sore throat can indicate strep. Fever and cough can  indicate influenza or pneumonia. Any kind of severe symptom such as difficulty breathing, intractable vomiting, or severe pain should prompt you to see a doctor as soon as possible.   Your body's immune system is really the thing that will get rid of this infection.  Your immune system is comprised of 2 types of specialized cells called T cells and B cells.  T cells coordinate the array of cells in your body that engulf invading bacteria or viruses while B cells orchestrate the production of antibodies that neutralize infection.  Anything we do or any medications we give you, will just strengthen your immune system or help it clear up the infection quicker.  Here are a few helpful hints to improve your immune system to help overcome this illness or to prevent future infections:  A few vitamins can improve the health of your immune system.  That's why your diet should include plenty of fruits, vegetables, fish, nuts, and whole grains.  Vitamin A and bet-carotene can increase the cells that fight infections (T cells and B cells).  Vitamin A is abundant in dark greens and orange vegetables such as spinach, greens, sweet potatoes, and carrots.  Vitamin B6 contributes to the maturation of white blood cells, the cells that fight disease.  Foods with vitamin B6 include cold cereal and bananas.  Vitamin C is credited with preventing colds because it increases white blood cells and also prevents cellular damage.  Citrus fruits, peaches and green and red bell peppers are all hight in vitamin C.  Vitamin E is an anti-oxidant that encourages the production of natural killer cells which reject foreign invaders and B cells that produce antibodies.  Foods high in vitamin E include wheat germ, nuts and seeds.  Foods high in omega-3 fatty acids found in foods like salmon, tuna and mackerel boost your immune system and help cells to engulf and absorb germs.  Probiotics are good bacteria that increase your T cells.   These can be found in yogurt and are available in supplements such as Culturelle or Align.  Moderate exercise increases the strength of your immune system and your ability to recover from illness.  I suggest 3 to 5 moderate intensity 30 minute workouts per week.    Sleep is another component of maintaining a strong immune system.  It enables your body to recuperate from the day's activities, stress and work.  My recommendation is to get between 7 and 9 hours of sleep per night.  If you smoke, try to quit completely or at least cut  down.  Drink alcohol only in moderation if at all.  No more than 2 drinks daily for men or 1 for women.  Get a flu vaccine early in the fall or if you have not gotten one yet, once this illness has run its course.  If you are over 65, a smoker, or an asthmatic, get a pneumococcal vaccine.  My final recommendation is to maintain a healthy weight.  Excess weight can impair the immune system by interfering with the way the immune system deals with invading viruses or bacteria.

## 2014-01-22 ENCOUNTER — Telehealth (HOSPITAL_COMMUNITY): Payer: Self-pay | Admitting: *Deleted

## 2014-01-22 NOTE — Telephone Encounter (Signed)
Telephoned patient at home # and is not a valid #. Telephoned patient's father's # and left message.

## 2014-03-15 ENCOUNTER — Telehealth (HOSPITAL_COMMUNITY): Payer: Self-pay | Admitting: *Deleted

## 2014-03-15 NOTE — Telephone Encounter (Signed)
Telephoned patient at home # and discussed negative pap smear results. Next pap smear due in 3 years. Patient voiced understanding.  

## 2014-03-27 ENCOUNTER — Emergency Department (HOSPITAL_COMMUNITY)
Admission: EM | Admit: 2014-03-27 | Discharge: 2014-03-27 | Disposition: A | Discharge status: Home / Self Care | Payer: Self-pay | Attending: Emergency Medicine | Admitting: Emergency Medicine

## 2014-03-27 ENCOUNTER — Encounter (HOSPITAL_COMMUNITY): Payer: Self-pay | Admitting: Emergency Medicine

## 2014-03-27 DIAGNOSIS — Z79899 Other long term (current) drug therapy: Secondary | ICD-10-CM | POA: Insufficient documentation

## 2014-03-27 DIAGNOSIS — Z7952 Long term (current) use of systemic steroids: Secondary | ICD-10-CM | POA: Insufficient documentation

## 2014-03-27 DIAGNOSIS — N760 Acute vaginitis: Secondary | ICD-10-CM | POA: Insufficient documentation

## 2014-03-27 DIAGNOSIS — Z87891 Personal history of nicotine dependence: Secondary | ICD-10-CM | POA: Insufficient documentation

## 2014-03-27 DIAGNOSIS — Z791 Long term (current) use of non-steroidal anti-inflammatories (NSAID): Secondary | ICD-10-CM | POA: Insufficient documentation

## 2014-03-27 DIAGNOSIS — Z7982 Long term (current) use of aspirin: Secondary | ICD-10-CM | POA: Insufficient documentation

## 2014-03-27 DIAGNOSIS — B9689 Other specified bacterial agents as the cause of diseases classified elsewhere: Secondary | ICD-10-CM

## 2014-03-27 DIAGNOSIS — Z3202 Encounter for pregnancy test, result negative: Secondary | ICD-10-CM | POA: Insufficient documentation

## 2014-03-27 DIAGNOSIS — Z792 Long term (current) use of antibiotics: Secondary | ICD-10-CM | POA: Insufficient documentation

## 2014-03-27 DIAGNOSIS — N390 Urinary tract infection, site not specified: Secondary | ICD-10-CM | POA: Insufficient documentation

## 2014-03-27 LAB — WET PREP, GENITAL
TRICH WET PREP: NONE SEEN
YEAST WET PREP: NONE SEEN

## 2014-03-27 LAB — URINALYSIS, ROUTINE W REFLEX MICROSCOPIC
Bilirubin Urine: NEGATIVE
Glucose, UA: NEGATIVE mg/dL
Hgb urine dipstick: NEGATIVE
Ketones, ur: NEGATIVE mg/dL
NITRITE: POSITIVE — AB
PROTEIN: NEGATIVE mg/dL
SPECIFIC GRAVITY, URINE: 1.016 (ref 1.005–1.030)
Urobilinogen, UA: 0.2 mg/dL (ref 0.0–1.0)
pH: 6.5 (ref 5.0–8.0)

## 2014-03-27 LAB — URINE MICROSCOPIC-ADD ON

## 2014-03-27 LAB — PREGNANCY, URINE: PREG TEST UR: NEGATIVE

## 2014-03-27 MED ORDER — KETOROLAC TROMETHAMINE 30 MG/ML IJ SOLN
30.0000 mg | Freq: Once | INTRAMUSCULAR | Status: AC
  Administered 2014-03-27: 30 mg via INTRAMUSCULAR
  Filled 2014-03-27: qty 1

## 2014-03-27 MED ORDER — CEPHALEXIN 500 MG PO CAPS: 500.0000 mg | ORAL_CAPSULE | Freq: Two times a day (BID) | ORAL | Status: DC

## 2014-03-27 MED ORDER — NITROFURANTOIN MONOHYD MACRO 100 MG PO CAPS
100.0000 mg | ORAL_CAPSULE | Freq: Once | ORAL | Status: DC
  Filled 2014-03-27: qty 1

## 2014-03-27 MED ORDER — OXYCODONE-ACETAMINOPHEN 5-325 MG PO TABS
1.0000 | ORAL_TABLET | Freq: Once | ORAL | Status: AC
  Administered 2014-03-27: 1 via ORAL
  Filled 2014-03-27: qty 1

## 2014-03-27 MED ORDER — HYDROCODONE-ACETAMINOPHEN 5-325 MG PO TABS: 1.0000 | ORAL_TABLET | ORAL | Status: DC | PRN

## 2014-03-27 MED ORDER — IBUPROFEN 400 MG PO TABS: 400.0000 mg | ORAL_TABLET | Freq: Four times a day (QID) | ORAL | Status: DC | PRN

## 2014-03-27 MED ORDER — METRONIDAZOLE 500 MG PO TABS: 500.0000 mg | ORAL_TABLET | Freq: Two times a day (BID) | ORAL | Status: DC

## 2014-03-27 MED ORDER — CEPHALEXIN 250 MG PO CAPS
500.0000 mg | ORAL_CAPSULE | Freq: Once | ORAL | Status: AC
  Administered 2014-03-27: 500 mg via ORAL
  Filled 2014-03-27: qty 2

## 2014-03-27 NOTE — ED Provider Notes (Signed)
CSN: [id number]     Arrival date & time 03/27/14  1850 History   First MD Initiated Contact with Patient 03/27/14 2052     Chief Complaint  Patient presents with  . Pelvic Pain     (Consider location/radiation/quality/duration/timing/severity/associated sxs/prior Treatment) HPI Comments: The patient is a 24 year old female presenting emergency room chief complaint of low abdominal discomfort and abnormal vaginal discharge. Patient reports low abdominal discomfort for 2 days. She reports associated dysuria and mild low back pain. Patient reports subjective fever and chills.  Patient also claims about abnormal mental discharge for several months. Denies new sexual partners. Patient states she has not concerned about a possible STI. Patient's last menstrual period was 03/10/2014. Reports persistent spotting since, this is normal for her.  Last treatment for a UTI >6 months ago.  The history is provided by the patient. No language interpreter was used.    History reviewed. No pertinent past medical history. Past Surgical History  Procedure Laterality Date  . Right hand surgery     . Right wrist surgery     Family History  Problem Relation Age of Onset  . Hypertension Father   . Cancer Paternal Grandfather     skin cancer    History  Substance Use Topics  . Smoking status: Former Smoker -- 0.50 packs/day for 5 years    Types: Cigarettes  . Smokeless tobacco: Never Used     Comment: about 5 cigarettes a day  . Alcohol Use: No   OB History    Gravida Para Term Preterm AB TAB SAB Ectopic Multiple Living   0              Review of Systems  Constitutional: Negative for fever and chills.  Gastrointestinal: Positive for abdominal pain. Negative for nausea, vomiting and constipation.  Genitourinary: Positive for dysuria, vaginal bleeding and vaginal discharge.      Allergies  Review of patient's allergies indicates no known allergies.  Home Medications   Prior to Admission  medications   Medication Sig Start Date End Date Taking? Authorizing Provider  albuterol (PROVENTIL HFA;VENTOLIN HFA) 108 (90 BASE) MCG/ACT inhaler Inhale 2 puffs into the lungs 4 (four) times daily. 01/06/14   Reuben Likes, MD  aspirin 325 MG tablet Take 650 mg by mouth every 4 (four) hours as needed for mild pain.    Historical Provider, MD  Cholecalciferol (VITAMIN D PO) Take 1 tablet by mouth daily.    Historical Provider, MD  guaiFENesin-codeine (ROBITUSSIN AC) 100-10 MG/5ML syrup Take 5 mLs by mouth 3 (three) times daily as needed for cough. 01/06/14   Reuben Likes, MD  naproxen (NAPROSYN) 500 MG tablet Take 1 tablet (500 mg total) by mouth 2 (two) times daily. 11/24/13   Fayrene Helper, PA-C  penicillin v potassium (VEETID) 500 MG tablet Take 1 tablet (500 mg total) by mouth 3 (three) times daily. 11/24/13   Fayrene Helper, PA-C  predniSONE (DELTASONE) 20 MG tablet Take 3 daily for 5 days, 2 daily for 5 days, 1 daily for 5 days. 01/06/14   Reuben Likes, MD  traMADol (ULTRAM) 50 MG tablet Take 2 tablets (100 mg total) by mouth every 8 (eight) hours as needed. 01/06/14   Reuben Likes, MD   BP 118/83 mmHg  Pulse 108  Temp(Src) 98.1 F (36.7 C) (Oral)  Resp 18  Ht  (1.473 m)  Wt 90 lb (40.824 kg)  BMI 18.82 kg/m2  SpO2 100%  LMP 03/10/2014 Physical  Exam  Constitutional: She is oriented to person, place, and time. She appears well-developed and well-nourished. No distress.  HENT:  Head: Normocephalic and atraumatic.  Eyes: EOM are normal.  Neck: Neck supple.  Pulmonary/Chest: Effort normal. No respiratory distress. She has no wheezes. She has no rales.  Abdominal: Soft. There is tenderness in the suprapubic area. There is no rigidity, no guarding and no CVA tenderness.  Genitourinary:  Moderate amount of brown discharge in posterior vaginal vault. Chaperone present. Mild right CVA tenderness.  Musculoskeletal: Normal range of motion.  Neurological: She is alert and oriented to  person, place, and time.  Skin: Skin is warm and dry. She is not diaphoretic.  Psychiatric: She has a normal mood and affect. Her behavior is normal.  Nursing note and vitals reviewed.   ED Course  Procedures (including critical care time) Labs Review Labs Reviewed  WET PREP, GENITAL - Abnormal; Notable for the following:    Clue Cells Wet Prep HPF POC FEW (*)    WBC, Wet Prep HPF POC FEW (*)    All other components within normal limits  URINALYSIS, ROUTINE W REFLEX MICROSCOPIC - Abnormal; Notable for the following:    APPearance HAZY (*)    Nitrite POSITIVE (*)    Leukocytes, UA TRACE (*)    All other components within normal limits  URINE MICROSCOPIC-ADD ON - Abnormal; Notable for the following:    Squamous Epithelial / LPF MANY (*)    Bacteria, UA MANY (*)    All other components within normal limits  GC/CHLAMYDIA PROBE AMP  URINE CULTURE  PREGNANCY, URINE    Imaging Review No results found.   EKG Interpretation None      MDM   Final diagnoses:  UTI (lower urinary tract infection)  BV (bacterial vaginosis)   Patient presents with lower abdominal discomfort for 2 days and abnormal vaginal discharge. Urine pregnancy negative urine shows infection. Patient has spotting and discharge on pelvic with some left-sided and mild cervical motion tenderness and no mass. Patient declines treatment for STI at this time. Plan to treat for UTI and BV. Discussed lab results and treatment plan with the patient. Return precautions given. Reports understanding and no other concerns at this time.  Patient is stable for discharge at this time.   Mellody Drown, PA-C 03/28/14 1610  Purvis Sheffield, MD 03/28/14 [phone]

## 2014-03-27 NOTE — ED Notes (Signed)
Pt c/o pelvic pain x 2 days with foul odor, white vaginal discharge. Pt denies N/V.

## 2014-03-27 NOTE — Discharge Instructions (Signed)
Call for a follow up appointment with a Family or Primary Care Provider.  °Return if Symptoms worsen.   °Take medication as prescribed.  ° ° °Emergency Department Resource Guide °1) Find a Doctor and Pay Out of Pocket °Although you won't have to find out who is covered by your insurance plan, it is a good idea to ask around and get recommendations. You will then need to call the office and see if the doctor you have chosen will accept you as a new patient and what types of options they offer for patients who are self-pay. Some doctors offer discounts or will set up payment plans for their patients who do not have insurance, but you will need to ask so you aren't surprised when you get to your appointment. ° °2) Contact Your Local Health Department °Not all health departments have doctors that can see patients for sick visits, but many do, so it is worth a call to see if yours does. If you don't know where your local health department is, you can check in your phone book. The CDC also has a tool to help you locate your state's health department, and many state websites also have listings of all of their local health departments. ° °3) Find a Walk-in Clinic °If your illness is not likely to be very severe or complicated, you may want to try a walk in clinic. These are popping up all over the country in pharmacies, drugstores, and shopping centers. They're usually staffed by nurse practitioners or physician assistants that have been trained to treat common illnesses and complaints. They're usually fairly quick and inexpensive. However, if you have serious medical issues or chronic medical problems, these are probably not your best option. ° °No Primary Care Doctor: °- Call [facility] at  832-8000 - they can help you locate a primary care doctor that  accepts your insurance, provides certain services, etc. °- Physician Referral Service- 1-800-533-3463 ° °Chronic Pain Problems: °Organization         Address  Phone    Notes  °[facility]  (336) 297-2271 Patients need to be referred by their primary care doctor.  ° °Medication Assistance: °Organization         Address  Phone   Notes  °[place] [facility] [address] °[place], [place] [address] (336) 641-8030 --Must be a resident of [place] °-- Must have NO insurance coverage whatsoever (no Medicaid/ Medicare, etc.) °-- The pt. MUST have a primary care doctor that directs their care regularly and follows them in the community °  °[facility]  (866) 331-1348   °[employer]  (888) 892-1162   ° °Agencies that provide inexpensive medical care: °Organization         Address  Phone   Notes  °[facility]  (336) 832-8035   °[facility]    (336) 832-7272   °[facility] [address] °[place], [place] [address] (336) 832-4777   °[facility] [address], °[place] (336) 271-4999   °[facility]    (336) 373-0678   °[facility]    (336) 272-1050   °[facility] ° [address], [place] Phone:  (336) 832-4444, Fax:  (336) 832-4440 Hours of Operation:  9 am - 6 pm, M-F.  Also accepts Medicaid/Medicare and self-pay.  °[facility] ° [address], [place]   Phone: (336) 832-3150, Fax: (336) 832-3151. Hours of Operation:  8:30 am - 5:30 pm, M-F.  Also accepts Medicaid and self-pay.  °[facility] [place] [address], [place] Phone: (336) 878-6027   °[facility] [address], [place], [place] (336)723-1848, Ext. 123 Mondays & Thursdays: 7-9 AM.  First 15 patients are seen on a first come, first serve basis. °  ° °Medicaid-accepting [place] Providers: ° °Organization         Address  Phone   Notes  °[facility] [address], [place] (336) 641-2100 Also accepts self-pay patients.  °[facility]  [address], [place] ° (336) 856-9996   °[facility] [address], [place] (336) 288-8857   °[facility] [address] [place] [address], [place] (336) 299-7000   °Veita Bland [address], [place]  ° (336) 373-1557 Only accepts [place] Access Medicaid patients after they have their name applied to their card.  ° °Self-Pay (no insurance) in [place]: ° °Organization         Address  Phone   Notes  °Sickle Cell Patients, [facility] [address], [place] (336) 832-1970   °[facility] [address], [place] (336) 832-4400   °[facility] ° [address], [place] (336) 992-4800   °[facility]/Dr. Osei-Bonsu ° [address] [place] [address], [place] or [address], [place] (336) 841-8500 Phone number for both [place] and [place] locations is the same.  °[facility] [address], [place] (336) 299-0000   °[facility] [address] [place] [address], [place] or [address] (336) 852-7530 °(336) 878-2260   °[facility] [address], [place] (336) 350-1642, phone; (336) 294-5005, fax Sees patients 1st and 3rd Saturday of every month.  Must not qualify for public or private insurance (i.e. Medicaid, Medicare, [place] Health Choice, Veterans' Benefits) • Household income should be no more than 200% of the poverty level •The clinic cannot treat you if you are pregnant or think you are pregnant • Sexually transmitted diseases are not treated at the clinic.  ° ° °Dental Care: °Organization         Address  Phone  Notes  °[facility] [address], [place] (336) 641-6152 Accepts children up to age 21 who are enrolled in Medicaid or [place] Health Choice; pregnant women with a Medicaid card; and children who have  applied for Medicaid or [place] Health Choice, but were declined, whose parents can pay a reduced fee at time of service.  °[facility] [place]  [address], [place] (336) 641-7733 Accepts children up to age 21 who are enrolled in Medicaid or [place] Health Choice; pregnant women with a Medicaid card; and children who have applied for Medicaid or [place] Health Choice, but were declined, whose parents can pay a reduced fee at time of service.  °[facility] ° [address], [place] (336) 641-4533 Patients are seen by appointment only. Walk-ins are not accepted. [facility] will see patients 18 years of age and older. °Monday - Tuesday (8am-5pm) °Most Wednesdays (8:30-5pm) °$30 per visit, cash only  °[facility] ° [address], [place] (336) 641-4533 Patients are seen by appointment only. Walk-ins are not accepted. [facility] will see patients 18 years of age and older. °One Wednesday Evening (Monthly: Volunteer Based).  $30 per visit, cash only  °[facility]  (919) 537-3737 for adults; Children under age 4, call Graduate Pediatric Dentistry at (919) [phone]. Children aged 4-14, please call (919) [phone] to request a pediatric application. ° Dental services are provided in all areas of dental care including fillings, crowns and bridges, complete and partial dentures, implants, gum treatment, root canals, and extractions. Preventive care is also provided. Treatment is provided to both adults and children. °Patients are selected via a lottery and there is often a waiting list. °  °[facility] [address], °[place] ° (336) 763-8833 [email or web address] °  °[facility] [address], [place], [place] (336)723-1848, Ext. 123 Second and Fourth Thursday of each month, opens at 6:30 AM; Clinic ends at 9 AM.  Patients are seen on a first-come first-served basis, and a  limited number are seen during each clinic.  ° °[facility] ° [address], [place], [place] (336) 723-7904   Eligibility Requirements °You must have lived in [place], [place], or [place] counties for at least the last three months. °  You cannot be eligible for state or federal sponsored healthcare insurance, including Veterans Administration, Medicaid, or Medicare. °  You generally cannot be eligible for healthcare insurance through your employer.  °  How to apply: °Eligibility screenings are held every Tuesday and Wednesday afternoon from 1:00 pm until 4:00 pm. You do not need an appointment for the interview!  °[facility] [address], [place], [place] 336-631-2330   °[facility]  336-342-8273   °[facility]  336-703-3100   °[facility]  336-570-6415   ° °Behavioral Health Resources in the Community: °Intensive Outpatient Programs °Organization         Address  Phone  Notes  °[facility] [address], [place], [place] 336-878-6098   °[facility] [address], [place], [place] 336-832-9800   °[facility] [address], [place], [place] ° 336-882-2125   °[facility] [address],  °[place], [place] 1-800-853-5163 or 336-641-4981   °Substance Abuse Resources °Organization         Address  Phone  Notes  °[facility]  336-882-2125   °[facility]  336-784-9470   °[facility]  336-285-9073   °[facility]  336-845-3988   °Residential & Outpatient Substance Abuse Program  1-800-659-3381   °Psychological Services °Organization         Address  Phone  Notes  °[facility]  336- 832-9600   °[facility]  336- 378-7881   °[facility] [address], [place] 1-800-853-5163 or 336-641-4981   ° °Mobile Crisis Teams °Organization          Address  Phone  Notes  °[facility], Mobile Crisis Care Unit  1-877-626-1772   °Assertive °[facility] ° [address] [place], [place] 336-834-9664   °Sharon DeEsch [address] °[place] 336-554-5454   ° °Self-Help/Support Groups °Organization         Address  Phone             Notes  °[facility] - variety of   support groups  336- 373-1402 Call for more information  °Narcotics Anonymous (NA), [facility] [address], °[place]  2 meetings at this location  ° °Residential Treatment Programs °Organization         Address  Phone  Notes  °[facility] [address],    °[place]  1-866-801-8205   °[facility] ° [address], [place], [place] 704-293-8524   °[facility] [address], [place] 336-845-3988 Admissions: 8am-3pm M-F  °[facility] [address]    °[place], [place] 336-841-1104   °[facility] [address], [place], [place] 336-379-7146   °[facility] [address]  °[place], [place] 336-285-9073   °[facility] - Intensive Outpatient [address], [place], [place] 336-852-3033   °[facility] [address]  °[place], [place] 1-877-615-2722 or 336-784-9470   °[facility] [address] [place] [address] [place], [place] 336-227-7417 Accepts Medicaid  °[facility] [address]  ° [place] 1-800-659-3381 Substance Abuse/Addiction Treatment  ° °[place] Behavioral Health Resources °Organization         Address  Phone  Notes  °[facility]  (888) 581-9988   °Julie Brannon, PhD [address] [place], [place]   (336) 349-5553 or (336) 951-0000   °[facility]   [address] °[place], [place] (336) 349-4454   °[facility] [address], [place], [place] (336) 342-8316 Insurance/Medicaid/sponsorship  through [facility]  °[facility] [address]                                    [place], [place] (336) 342-8316 Therapy/tele-psych/case  °[facility] [address]  ° [place], [place] (336) 349-2233    °Dr. Arfeen  (336) 349-4544   °[facility] [place]  [employer] [place] [facility] 1) [address], [place] °2) [address], [place] °3)  [address], [place] (336) 349-3220 °(336) 342-7768 ° °(336) 342-8140   °[place] [facility] (336) 342-1394 or (336) 342-3537 (After Hours)    ° °

## 2014-03-27 NOTE — ED Notes (Signed)
Estimated wait time given

## 2014-03-30 LAB — URINE CULTURE: Colony Count: >=100000

## 2014-03-31 ENCOUNTER — Telehealth (HOSPITAL_BASED_OUTPATIENT_CLINIC_OR_DEPARTMENT_OTHER): Payer: Self-pay | Admitting: Emergency Medicine

## 2014-03-31 LAB — GC/CHLAMYDIA PROBE AMP
CT Probe RNA: NEGATIVE
GC PROBE AMP APTIMA: NEGATIVE

## 2014-03-31 NOTE — Telephone Encounter (Signed)
Post ED Visit - Positive Culture Follow-up  Culture report reviewed by antimicrobial stewardship pharmacist: []  Wes Dulaney, Pharm.D., BCPS [x]  Celedonio MiyamotoJeremy Frens, Pharm.D., BCPS []  Georgina PillionElizabeth Martin, [address] BCPS []  OakwoodMinh Pham, [address]., BCPS, AAHIVP []  Estella HuskMichelle Turner, Pharm.D., BCPS, AAHIVP []  Elder CyphersLorie Poole, [address]., BCPS  Positive urine culture Treated with Cephalexin, organism sensitive to the same and no further patient follow-up is required at this time.  Jiles HaroldGammons, Rokia Bosket Chaney 03/31/2014, 4:14 PM

## 2014-04-07 ENCOUNTER — Encounter (HOSPITAL_COMMUNITY): Payer: Self-pay | Admitting: *Deleted

## 2014-04-07 ENCOUNTER — Emergency Department (HOSPITAL_COMMUNITY)
Admission: EM | Admit: 2014-04-07 | Discharge: 2014-04-07 | Disposition: A | Discharge status: Home / Self Care | Payer: Self-pay | Attending: Emergency Medicine | Admitting: Emergency Medicine

## 2014-04-07 DIAGNOSIS — Z792 Long term (current) use of antibiotics: Secondary | ICD-10-CM | POA: Insufficient documentation

## 2014-04-07 DIAGNOSIS — Z79899 Other long term (current) drug therapy: Secondary | ICD-10-CM | POA: Insufficient documentation

## 2014-04-07 DIAGNOSIS — L02411 Cutaneous abscess of right axilla: Secondary | ICD-10-CM | POA: Insufficient documentation

## 2014-04-07 DIAGNOSIS — Z7982 Long term (current) use of aspirin: Secondary | ICD-10-CM | POA: Insufficient documentation

## 2014-04-07 DIAGNOSIS — R1110 Vomiting, unspecified: Secondary | ICD-10-CM | POA: Insufficient documentation

## 2014-04-07 DIAGNOSIS — Z7952 Long term (current) use of systemic steroids: Secondary | ICD-10-CM | POA: Insufficient documentation

## 2014-04-07 DIAGNOSIS — Z87891 Personal history of nicotine dependence: Secondary | ICD-10-CM | POA: Insufficient documentation

## 2014-04-07 MED ORDER — TRAMADOL HCL 50 MG PO TABS: 50.0000 mg | ORAL_TABLET | Freq: Four times a day (QID) | ORAL | Status: DC | PRN

## 2014-04-07 MED ORDER — LIDOCAINE-EPINEPHRINE (PF) 2 %-1:200000 IJ SOLN
10.0000 mL | Freq: Once | INTRAMUSCULAR | Status: DC
  Filled 2014-04-07: qty 20

## 2014-04-07 NOTE — Discharge Instructions (Signed)
Change your dressing at least once per day. Recommend that you warm compresses 3-4 times per day. Have your wound rechecked in 48 hours by your primary care provider or in the ED. Return sooner if symptoms worsen.  Abscess An abscess is an infected area that contains a collection of pus and debris.It can occur in almost any part of the body. An abscess is also known as a furuncle or boil. CAUSES  An abscess occurs when tissue gets infected. This can occur from blockage of oil or sweat glands, infection of hair follicles, or a minor injury to the skin. As the body tries to fight the infection, pus collects in the area and creates pressure under the skin. This pressure causes pain. People with weakened immune systems have difficulty fighting infections and get certain abscesses more often.  SYMPTOMS Usually an abscess develops on the skin and becomes a painful mass that is red, warm, and tender. If the abscess forms under the skin, you may feel a moveable soft area under the skin. Some abscesses break open (rupture) on their own, but most will continue to get worse without care. The infection can spread deeper into the body and eventually into the bloodstream, causing you to feel ill.  DIAGNOSIS  Your caregiver will take your medical history and perform a physical exam. A sample of fluid may also be taken from the abscess to determine what is causing your infection. TREATMENT  Your caregiver may prescribe antibiotic medicines to fight the infection. However, taking antibiotics alone usually does not cure an abscess. Your caregiver may need to make a small cut (incision) in the abscess to drain the pus. In some cases, gauze is packed into the abscess to reduce pain and to continue draining the area. HOME CARE INSTRUCTIONS   Only take over-the-counter or prescription medicines for pain, discomfort, or fever as directed by your caregiver.  If you were prescribed antibiotics, take them as directed. Finish  them even if you start to feel better.  If gauze is used, follow your caregiver's directions for changing the gauze.  To avoid spreading the infection:  Keep your draining abscess covered with a bandage.  Wash your hands well.  Do not share personal care items, towels, or whirlpools with others.  Avoid skin contact with others.  Keep your skin and clothes clean around the abscess.  Keep all follow-up appointments as directed by your caregiver. SEEK MEDICAL CARE IF:   You have increased pain, swelling, redness, fluid drainage, or bleeding.  You have muscle aches, chills, or a general ill feeling.  You have a fever. MAKE SURE YOU:   Understand these instructions.  Will watch your condition.  Will get help right away if you are not doing well or get worse. Document Released: 12/20/2004 Document Revised: 09/11/2011 Document Reviewed: 05/25/2011 [facility] Patient Information 2015 [place], [place]. This information is not intended to replace advice given to you by your health care provider. Make sure you discuss any questions you have with your health care provider.  Abscess Care After An abscess (also called a boil or furuncle) is an infected area that contains a collection of pus. Signs and symptoms of an abscess include pain, tenderness, redness, or hardness, or you may feel a moveable soft area under your skin. An abscess can occur anywhere in the body. The infection may spread to surrounding tissues causing cellulitis. A cut (incision) by the surgeon was made over your abscess and the pus was drained out. Gauze may  have been packed into the space to provide a drain that will allow the cavity to heal from the inside outwards. The boil may be painful for 5 to 7 days. Most people with a boil do not have high fevers. Your abscess, if seen early, may not have localized, and may not have been lanced. If not, another appointment may be required for this if it does not get better on its own  or with medications. HOME CARE INSTRUCTIONS   Only take over-the-counter or prescription medicines for pain, discomfort, or fever as directed by your caregiver.  When you bathe, soak and then remove gauze or iodoform packs at least daily or as directed by your caregiver. You may then wash the wound gently with mild soapy water. Repack with gauze or do as your caregiver directs. SEEK IMMEDIATE MEDICAL CARE IF:   You develop increased pain, swelling, redness, drainage, or bleeding in the wound site.  You develop signs of generalized infection including muscle aches, chills, fever, or a general ill feeling.  An oral temperature above 102 F (38.9 C) develops, not controlled by medication. See your caregiver for a recheck if you develop any of the symptoms described above. If medications (antibiotics) were prescribed, take them as directed. Document Released: 09/28/2004 Document Revised: 06/04/2011 Document Reviewed: 05/26/2007 [facility] Patient Information 2015 [place], [place]. This information is not intended to replace advice given to you by your health care provider. Make sure you discuss any questions you have with your health care provider.

## 2014-04-07 NOTE — ED Provider Notes (Signed)
CSN: [id number]     Arrival date & time 04/07/14  1941 History  This chart was scribed for non-physician practitioner, Antony Madura, PA-C,working with Mirian Mo, MD, by Karle Plumber, ED Scribe. This patient was seen in room WTR6/WTR6 and the patient's care was started at 9:02 PM.  Chief Complaint  Patient presents with  . Abscess   The history is provided by the patient. No language interpreter was used.    HPI Comments:  Judy Pham is a 24 y.o. female who presents to the Emergency Department complaining of an abscess to her right axilla that began about five days ago. She reports severe associated pain and two episodes of emesis earlier today. She reports applying a "salve" and applying warm compresses with no significant relief of the pain. Holding her arm down by her side makes the pain worse. Denies alleviating factors. Denies fever, chills, red streaking, warmth of the area, numbness, tingling or weakness of the RUE.   History reviewed. No pertinent past medical history. Past Surgical History  Procedure Laterality Date  . Right hand surgery     . Right wrist surgery     Family History  Problem Relation Age of Onset  . Hypertension Father   . Cancer Paternal Grandfather     skin cancer    History  Substance Use Topics  . Smoking status: Former Smoker -- 0.50 packs/day for 5 years    Types: Cigarettes  . Smokeless tobacco: Never Used     Comment: about 5 cigarettes a day  . Alcohol Use: No   OB History    Gravida Para Term Preterm AB TAB SAB Ectopic Multiple Living   0              Review of Systems  Constitutional: Negative for fever and chills.  Gastrointestinal: Positive for vomiting.  Skin: Positive for color change (abscess to right axilla). Negative for wound.  Neurological: Negative for weakness and numbness.  All other systems reviewed and are negative.   Allergies  Review of patient's allergies indicates no known allergies.  Home Medications    Prior to Admission medications   Medication Sig Start Date End Date Taking? Authorizing Provider  albuterol (PROVENTIL HFA;VENTOLIN HFA) 108 (90 BASE) MCG/ACT inhaler Inhale 2 puffs into the lungs 4 (four) times daily. Patient not taking: Reported on 03/27/2014 01/06/14   Reuben Likes, MD  aspirin 325 MG tablet Take 650 mg by mouth every 4 (four) hours as needed for mild pain.    Historical Provider, MD  cephALEXin (KEFLEX) 500 MG capsule Take 1 capsule (500 mg total) by mouth 2 (two) times daily. 03/27/14   Mellody Drown, PA-C  Cholecalciferol (VITAMIN D PO) Take 1 tablet by mouth daily.    Historical Provider, MD  guaiFENesin-codeine (ROBITUSSIN AC) 100-10 MG/5ML syrup Take 5 mLs by mouth 3 (three) times daily as needed for cough. Patient not taking: Reported on 03/27/2014 01/06/14   Reuben Likes, MD  HYDROcodone-acetaminophen (NORCO/VICODIN) 5-325 MG per tablet Take 1 tablet by mouth every 4 (four) hours as needed for moderate pain or severe pain. 03/27/14   Mellody Drown, PA-C  ibuprofen (ADVIL,MOTRIN) 400 MG tablet Take 1 tablet (400 mg total) by mouth every 6 (six) hours as needed. 03/27/14   Mellody Drown, PA-C  metroNIDAZOLE (FLAGYL) 500 MG tablet Take 1 tablet (500 mg total) by mouth 2 (two) times daily. 03/27/14   Mellody Drown, PA-C  naproxen (NAPROSYN) 500 MG tablet Take 1 tablet (500 mg  total) by mouth 2 (two) times daily. Patient not taking: Reported on 03/27/2014 11/24/13   Fayrene Helper, PA-C  penicillin v potassium (VEETID) 500 MG tablet Take 1 tablet (500 mg total) by mouth 3 (three) times daily. Patient not taking: Reported on 03/27/2014 11/24/13   Fayrene Helper, PA-C  predniSONE (DELTASONE) 20 MG tablet Take 3 daily for 5 days, 2 daily for 5 days, 1 daily for 5 days. Patient not taking: Reported on 03/27/2014 01/06/14   Reuben Likes, MD  traMADol (ULTRAM) 50 MG tablet Take 1 tablet (50 mg total) by mouth every 6 (six) hours as needed. 04/07/14   Antony Madura, PA-C   Triage Vitals: BP 121/79 mmHg   Pulse 115  Temp(Src) 98.3 F (36.8 C) (Oral)  Resp 18  SpO2 99%  LMP 03/10/2014  Physical Exam  Constitutional: She is oriented to person, place, and time. She appears well-developed and well-nourished. No distress.  Nontoxic/nonseptic appearing  HENT:  Head: Normocephalic and atraumatic.  Eyes: Conjunctivae and EOM are normal. No scleral icterus.  Neck: Normal range of motion.  Cardiovascular: Normal rate, regular rhythm and intact distal pulses.   Distal radial pulse 2+ in right upper extremity.  Pulmonary/Chest: Effort normal. No respiratory distress.  Respirations even and unlabored  Musculoskeletal: Normal range of motion.  Neurological: She is alert and oriented to person, place, and time. She exhibits normal muscle tone. Coordination normal.  Sensation to light touch intact in all extremities.  Skin: Skin is warm and dry. No rash noted. She is not diaphoretic. No erythema. No pallor.  Abscess to R axilla; 3cm in diameter with central fluctuance. Mild surrounding induration. No heat to touch or red streaking.  Psychiatric: She has a normal mood and affect. Her behavior is normal.  Nursing note and vitals reviewed.   ED Course  Procedures (including critical care time) DIAGNOSTIC STUDIES: Oxygen Saturation is 99% on RA, normal by my interpretation.   COORDINATION OF CARE: 9:06 PM- Will I & D abscess. Pt verbalizes understanding and agrees to plan.  INCISION AND DRAINAGE PROCEDURE NOTE: Patient identification was confirmed and verbal consent was obtained. This procedure was performed by Antony Madura, PA-C at 9:32 PM. Site: right axilla Sterile procedures observed Needle size: 25 G Anesthetic used (type and amt): Lidocaine 2% with Epinephrine (3 mLs) Blade size: 11 Drainage: 3 mLs Complexity: Complex Packing used: none Site anesthetized, incision made over site, wound drained and explored loculations, rinsed with copious amounts of normal saline, wound packed with  sterile gauze, covered with dry, sterile dressing.  Pt tolerated procedure well without complications.  Instructions for care discussed verbally and pt provided with additional written instructions for homecare and f/u.  Medications  lidocaine-EPINEPHrine (XYLOCAINE W/EPI) 2 %-1:200000 (PF) injection 10 mL (not administered)   Labs Review Labs Reviewed - No data to display  Imaging Review No results found.   EKG Interpretation None      MDM   Final diagnoses:  Abscess of right axilla    Patient with skin abscess amenable to incision and drainage. Abscess was not large enough to warrant packing or drain, wound recheck in 2 days. Encouraged home warm soaks and flushing. Mild signs of cellulitis is surrounding skin. Will d/c to home. No antibiotic therapy is indicated. Patient discharged in good condition; VSS.  I personally performed the services described in this documentation, which was scribed in my presence. The recorded information has been reviewed and is accurate.   Filed Vitals:   04/07/14 1949  04/07/14 2150  BP: 121/79 115/80  Pulse: 115 90  Temp: 98.3 F (36.8 C)   TempSrc: Oral   Resp: 18 18  SpO2: 99% 99%     Antony MaduraKelly Nevaeh Korte, PA-C 04/07/14 2303  Mirian MoMatthew Gentry, MD 04/09/14 [phone]

## 2014-04-07 NOTE — ED Notes (Signed)
Pt states she has an abscess in her right armpit since 5 days ago. Pt states the pain is 10/10 in her underarm.

## 2014-07-02 ENCOUNTER — Encounter (HOSPITAL_COMMUNITY): Payer: Self-pay | Admitting: Emergency Medicine

## 2014-07-02 ENCOUNTER — Emergency Department (HOSPITAL_COMMUNITY)
Admission: EM | Admit: 2014-07-02 | Discharge: 2014-07-02 | Disposition: A | Discharge status: Home / Self Care | Payer: Self-pay | Attending: Emergency Medicine | Admitting: Emergency Medicine

## 2014-07-02 DIAGNOSIS — Z7982 Long term (current) use of aspirin: Secondary | ICD-10-CM | POA: Insufficient documentation

## 2014-07-02 DIAGNOSIS — Z791 Long term (current) use of non-steroidal anti-inflammatories (NSAID): Secondary | ICD-10-CM | POA: Insufficient documentation

## 2014-07-02 DIAGNOSIS — Z87891 Personal history of nicotine dependence: Secondary | ICD-10-CM | POA: Insufficient documentation

## 2014-07-02 DIAGNOSIS — M25562 Pain in left knee: Secondary | ICD-10-CM | POA: Insufficient documentation

## 2014-07-02 DIAGNOSIS — Z792 Long term (current) use of antibiotics: Secondary | ICD-10-CM | POA: Insufficient documentation

## 2014-07-02 DIAGNOSIS — Z87828 Personal history of other (healed) physical injury and trauma: Secondary | ICD-10-CM | POA: Insufficient documentation

## 2014-07-02 DIAGNOSIS — Z79899 Other long term (current) drug therapy: Secondary | ICD-10-CM | POA: Insufficient documentation

## 2014-07-02 NOTE — ED Notes (Signed)
C/o left knee pain x 3 days. Hx of "problems with meniscus" in the past. Ambulates without difficulty.

## 2014-07-02 NOTE — ED Provider Notes (Signed)
CSN: [id number]     Arrival date & time 07/02/14  1145 History  This chart was scribed for non-physician practitioner, Teressa LowerVrinda Gilmer Kaminsky, NP working with Samuel JesterKathleen McManus, DO by Gwenyth Oberatherine Macek, ED scribe. This patient was seen in room TR05C/TR05C and the patient's care was started at 12:06 PM   Chief Complaint  Patient presents with  . Knee Pain   The history is provided by the patient. No language interpreter was used.   HPI Comments: Judy Pham is a 24 y.o. female who presents to the Emergency Department complaining of constant, moderate left knee pain that started 3 days ago. Pt reports that she was walking when she felt a tearing sensation in her left knee. She has a history of bilateral knee pain and states she was previously diagnosed with a meniscal injury. Pt denies difficulty walking as an associated symptom.   No past medical history on file. Past Surgical History  Procedure Laterality Date  . Right hand surgery     . Right wrist surgery     Family History  Problem Relation Age of Onset  . Hypertension Father   . Cancer Paternal Grandfather     skin cancer    History  Substance Use Topics  . Smoking status: Former Smoker -- 0.50 packs/day for 5 years    Types: Cigarettes  . Smokeless tobacco: Never Used     Comment: about 5 cigarettes a day  . Alcohol Use: No   OB History    Gravida Para Term Preterm AB TAB SAB Ectopic Multiple Living   0              Review of Systems  Musculoskeletal: Positive for arthralgias. Negative for gait problem.  All other systems reviewed and are negative.     Allergies  Review of patient's allergies indicates no known allergies.  Home Medications   Prior to Admission medications   Medication Sig Start Date End Date Taking? Authorizing Provider  albuterol (PROVENTIL HFA;VENTOLIN HFA) 108 (90 BASE) MCG/ACT inhaler Inhale 2 puffs into the lungs 4 (four) times daily. Patient not taking: Reported on 03/27/2014 01/06/14   Reuben Likesavid C  Keller, MD  aspirin 325 MG tablet Take 650 mg by mouth every 4 (four) hours as needed for mild pain.    Historical Provider, MD  cephALEXin (KEFLEX) 500 MG capsule Take 1 capsule (500 mg total) by mouth 2 (two) times daily. 03/27/14   Mellody DrownLauren Parker, PA-C  Cholecalciferol (VITAMIN D PO) Take 1 tablet by mouth daily.    Historical Provider, MD  guaiFENesin-codeine (ROBITUSSIN AC) 100-10 MG/5ML syrup Take 5 mLs by mouth 3 (three) times daily as needed for cough. Patient not taking: Reported on 03/27/2014 01/06/14   Reuben Likesavid C Keller, MD  HYDROcodone-acetaminophen (NORCO/VICODIN) 5-325 MG per tablet Take 1 tablet by mouth every 4 (four) hours as needed for moderate pain or severe pain. 03/27/14   Mellody DrownLauren Parker, PA-C  ibuprofen (ADVIL,MOTRIN) 400 MG tablet Take 1 tablet (400 mg total) by mouth every 6 (six) hours as needed. 03/27/14   Mellody DrownLauren Parker, PA-C  metroNIDAZOLE (FLAGYL) 500 MG tablet Take 1 tablet (500 mg total) by mouth 2 (two) times daily. 03/27/14   Mellody DrownLauren Parker, PA-C  naproxen (NAPROSYN) 500 MG tablet Take 1 tablet (500 mg total) by mouth 2 (two) times daily. Patient not taking: Reported on 03/27/2014 11/24/13   Fayrene HelperBowie Tran, PA-C  penicillin v potassium (VEETID) 500 MG tablet Take 1 tablet (500 mg total) by mouth 3 (three) times daily.  Patient not taking: Reported on 03/27/2014 11/24/13   Fayrene Helper, PA-C  predniSONE (DELTASONE) 20 MG tablet Take 3 daily for 5 days, 2 daily for 5 days, 1 daily for 5 days. Patient not taking: Reported on 03/27/2014 01/06/14   Reuben Likes, MD  traMADol (ULTRAM) 50 MG tablet Take 1 tablet (50 mg total) by mouth every 6 (six) hours as needed. 04/07/14   Antony Madura, PA-C   BP 126/69 mmHg  Pulse 101  Temp(Src) 97.7 F (36.5 C) (Oral)  Resp 18  SpO2 100% Physical Exam  Constitutional: She appears well-developed and well-nourished. No distress.  HENT:  Head: Normocephalic and atraumatic.  Eyes: Conjunctivae and EOM are normal.  Neck: Neck supple. No tracheal deviation  present.  Cardiovascular: Normal rate.   Pulmonary/Chest: Effort normal. No respiratory distress.  Musculoskeletal:  Full rom. No deformity or swelling noted  Skin: Skin is warm and dry.  Psychiatric: She has a normal mood and affect. Her behavior is normal.  Nursing note and vitals reviewed.   ED Course  Procedures   DIAGNOSTIC STUDIES: Oxygen Saturation is 100% on RA, normal by my interpretation.    COORDINATION OF CARE: 12:11 PM Discussed treatment plan with pt. She agreed to plan.   Labs Review Labs Reviewed - No data to display  Imaging Review No results found.   EKG Interpretation None      MDM   Final diagnoses:  Left knee pain    Pt has full rom. No swelling noted. Discussed follow up with ortho. Pt refusing x-ray  I personally performed the services described in this documentation, which was scribed in my presence. The recorded information has been reviewed and is accurate.    Teressa Lower, NP 07/02/14 1215  Samuel Jester, DO 07/03/14 1606

## 2014-07-02 NOTE — Discharge Instructions (Signed)

## 2014-08-06 ENCOUNTER — Emergency Department (HOSPITAL_COMMUNITY)
Admission: EM | Admit: 2014-08-06 | Discharge: 2014-08-07 | Disposition: A | Discharge status: Home / Self Care | Payer: Self-pay | Attending: Emergency Medicine | Admitting: Emergency Medicine

## 2014-08-06 ENCOUNTER — Encounter (HOSPITAL_COMMUNITY): Payer: Self-pay | Admitting: Emergency Medicine

## 2014-08-06 DIAGNOSIS — Z3202 Encounter for pregnancy test, result negative: Secondary | ICD-10-CM | POA: Insufficient documentation

## 2014-08-06 DIAGNOSIS — R1030 Lower abdominal pain, unspecified: Secondary | ICD-10-CM

## 2014-08-06 DIAGNOSIS — R109 Unspecified abdominal pain: Secondary | ICD-10-CM

## 2014-08-06 DIAGNOSIS — N390 Urinary tract infection, site not specified: Secondary | ICD-10-CM | POA: Insufficient documentation

## 2014-08-06 DIAGNOSIS — Z7982 Long term (current) use of aspirin: Secondary | ICD-10-CM | POA: Insufficient documentation

## 2014-08-06 DIAGNOSIS — R110 Nausea: Secondary | ICD-10-CM | POA: Insufficient documentation

## 2014-08-06 DIAGNOSIS — Z79899 Other long term (current) drug therapy: Secondary | ICD-10-CM | POA: Insufficient documentation

## 2014-08-06 DIAGNOSIS — Z720 Tobacco use: Secondary | ICD-10-CM | POA: Insufficient documentation

## 2014-08-06 DIAGNOSIS — R079 Chest pain, unspecified: Secondary | ICD-10-CM | POA: Insufficient documentation

## 2014-08-06 DIAGNOSIS — F419 Anxiety disorder, unspecified: Secondary | ICD-10-CM | POA: Insufficient documentation

## 2014-08-06 LAB — CBC WITH DIFFERENTIAL/PLATELET
BASOS ABS: 0 10*3/uL (ref 0.0–0.1)
Basophils Relative: 1 % (ref 0–1)
Eosinophils Absolute: 0.3 10*3/uL (ref 0.0–0.7)
Eosinophils Relative: 3 % (ref 0–5)
HCT: 41.3 % (ref 36.0–46.0)
HEMOGLOBIN: 14.4 g/dL (ref 12.0–15.0)
LYMPHS PCT: 39 % (ref 12–46)
Lymphs Abs: 2.9 10*3/uL (ref 0.7–4.0)
MCH: 28.9 pg (ref 26.0–34.0)
MCHC: 34.9 g/dL (ref 30.0–36.0)
MCV: 82.8 fL (ref 78.0–100.0)
MONOS PCT: 7 % (ref 3–12)
Monocytes Absolute: 0.5 10*3/uL (ref 0.1–1.0)
NEUTROS ABS: 3.8 10*3/uL (ref 1.7–7.7)
Neutrophils Relative %: 50 % (ref 43–77)
Platelets: 289 10*3/uL (ref 150–400)
RBC: 4.99 MIL/uL (ref 3.87–5.11)
RDW: 12.7 % (ref 11.5–15.5)
WBC: 7.6 10*3/uL (ref 4.0–10.5)

## 2014-08-06 LAB — COMPREHENSIVE METABOLIC PANEL
ALT: 9 U/L — ABNORMAL LOW (ref 14–54)
AST: 13 U/L — AB (ref 15–41)
Albumin: 4.2 g/dL (ref 3.5–5.0)
Alkaline Phosphatase: 73 U/L (ref 38–126)
Anion gap: 9 (ref 5–15)
BUN: 9 mg/dL (ref 6–20)
CO2: 26 mmol/L (ref 22–32)
Calcium: 9.5 mg/dL (ref 8.9–10.3)
Chloride: 103 mmol/L (ref 101–111)
Creatinine, Ser: 0.62 mg/dL (ref 0.44–1.00)
GFR calc Af Amer: >60 mL/min (ref >60)
GFR calc non Af Amer: >60 mL/min (ref >60)
GLUCOSE: 97 mg/dL (ref 65–99)
Potassium: 3.7 mmol/L (ref 3.5–5.1)
Sodium: 138 mmol/L (ref 135–145)
Total Bilirubin: 0.4 mg/dL (ref 0.3–1.2)
Total Protein: 7 g/dL (ref 6.5–8.1)

## 2014-08-06 LAB — I-STAT TROPONIN, ED: Troponin i, poc: 0 ng/mL (ref 0.00–0.08)

## 2014-08-06 LAB — LIPASE, BLOOD: LIPASE: 22 U/L (ref 22–51)

## 2014-08-06 MED ORDER — ONDANSETRON HCL 4 MG/2ML IJ SOLN
4.0000 mg | Freq: Once | INTRAMUSCULAR | Status: AC
  Administered 2014-08-06: 4 mg via INTRAVENOUS
  Filled 2014-08-06: qty 2

## 2014-08-06 MED ORDER — SODIUM CHLORIDE 0.9 % IV BOLUS (SEPSIS)
1000.0000 mL | Freq: Once | INTRAVENOUS | Status: AC
  Administered 2014-08-06: 1000 mL via INTRAVENOUS

## 2014-08-06 MED ORDER — MORPHINE SULFATE 4 MG/ML IJ SOLN
4.0000 mg | Freq: Once | INTRAMUSCULAR | Status: AC
  Administered 2014-08-06: 4 mg via INTRAVENOUS
  Filled 2014-08-06: qty 1

## 2014-08-06 MED ORDER — IOHEXOL 300 MG/ML  SOLN
50.0000 mL | Freq: Once | INTRAMUSCULAR | Status: AC | PRN
  Administered 2014-08-06: 50 mL via ORAL

## 2014-08-06 NOTE — ED Notes (Addendum)
C/o lower abd pain that radiates to back x 2 days.  Reports tightness in center of chest that started tonight with nausea.  Also reports dysuria.  States when she lifted L arm up tonight to stretch she could feel a "pop" in the center of her chest.

## 2014-08-06 NOTE — ED Provider Notes (Signed)
CSN: [id number]     Arrival date & time 08/06/14  2242 History  This chart was scribed for Judy Racer, MD by Phillis Haggis, ED Scribe. This patient was seen in room D35C/D35C and patient care was started at 11:21 PM.     Chief Complaint  Patient presents with  . Abdominal Pain  . Chest Pain   Patient is a 24 y.o. female presenting with abdominal pain and chest pain. The history is provided by the patient. No language interpreter was used.  Abdominal Pain Associated symptoms: chest pain, dysuria and nausea   Associated symptoms: no chills, no constipation, no cough, no diarrhea, no fever, no shortness of breath, no vaginal bleeding, no vaginal discharge and no vomiting   Chest Pain Associated symptoms: abdominal pain, back pain and nausea   Associated symptoms: no cough, no dizziness, no fever, no headache, no numbness, no palpitations, no shortness of breath, not vomiting and no weakness     HPI Comments: Judy Pham is a 24 y.o. female who presents to the Emergency Department complaining of gradually worsening abdominal pain onset 2 days ago. She states that she has pain in her suprapubic and LLQ that radiates to her back. She reports that the pain has been constant. She reports associated nausea, burning with urination, and being unable to urinate although having feelings of frequency. She reports associated chest pain shortly before arrival to the emergency department but that she states may be attributed to being upset. She states that this pain is similar to a UTI she had in the past. She reports her last menstrual period was 07/21/2014 and was normal. She denies fever, chills, vomiting, leg swelling, leg pain, or vaginal discharge or bleeding.  She denies any allergies to medication.   History reviewed. No pertinent past medical history. Past Surgical History  Procedure Laterality Date  . Right hand surgery     . Right wrist surgery     Family History  Problem Relation Age of  Onset  . Hypertension Father   . Cancer Paternal Grandfather     skin cancer    History  Substance Use Topics  . Smoking status: Current Every Day Smoker -- 0.50 packs/day for 5 years    Types: Cigarettes  . Smokeless tobacco: Never Used     Comment: about 5 cigarettes a day  . Alcohol Use: No   OB History    Gravida Para Term Preterm AB TAB SAB Ectopic Multiple Living   0              Review of Systems  Constitutional: Negative for fever and chills.  Respiratory: Negative for cough and shortness of breath.   Cardiovascular: Positive for chest pain. Negative for palpitations and leg swelling.  Gastrointestinal: Positive for nausea and abdominal pain. Negative for vomiting, diarrhea and constipation.  Genitourinary: Positive for dysuria, frequency, flank pain and difficulty urinating. Negative for vaginal bleeding and vaginal discharge.  Musculoskeletal: Positive for back pain. Negative for neck pain and neck stiffness.  Skin: Negative for rash and wound.  Neurological: Negative for dizziness, weakness, light-headedness, numbness and headaches.  All other systems reviewed and are negative.     Allergies  Review of patient's allergies indicates no known allergies.  Home Medications   Prior to Admission medications   Medication Sig Start Date End Date Taking? Authorizing Provider  acetaminophen (TYLENOL) 500 MG tablet Take 500 mg by mouth every 6 (six) hours as needed (pain).   Yes Historical Provider, MD  aspirin 325 MG tablet Take 650 mg by mouth every 4 (four) hours as needed for mild pain.   Yes Historical Provider, MD  Cholecalciferol (VITAMIN D PO) Take 1 tablet by mouth daily.   Yes Historical Provider, MD  ibuprofen (ADVIL,MOTRIN) 800 MG tablet Take 800 mg by mouth every 8 (eight) hours as needed (pain).   Yes Historical Provider, MD  albuterol (PROVENTIL HFA;VENTOLIN HFA) 108 (90 BASE) MCG/ACT inhaler Inhale 2 puffs into the lungs 4 (four) times daily. Patient not  taking: Reported on 03/27/2014 01/06/14   Reuben Likesavid C Keller, MD  cephALEXin (KEFLEX) 500 MG capsule Take 1 capsule (500 mg total) by mouth 2 (two) times daily. 08/07/14   Judy Raceravid Carianna Lague, MD  guaiFENesin-codeine [facility](ROBITUSSIN AC) 100-10 MG/5ML syrup Take 5 mLs by mouth 3 (three) times daily as needed for cough. Patient not taking: Reported on 03/27/2014 01/06/14   Reuben Likesavid C Keller, MD  HYDROcodone-acetaminophen (NORCO/VICODIN) 5-325 MG per tablet Take 1 tablet by mouth every 4 (four) hours as needed for moderate pain or severe pain. 08/07/14   Judy Raceravid Maurizio Geno, MD  ibuprofen (ADVIL,MOTRIN) 400 MG tablet Take 1 tablet (400 mg total) by mouth every 6 (six) hours as needed. Patient not taking: Reported on 08/07/2014 03/27/14   Mellody DrownLauren Parker, PA-C  metroNIDAZOLE (FLAGYL) 500 MG tablet Take 1 tablet (500 mg total) by mouth 2 (two) times daily. Patient not taking: Reported on 08/07/2014 03/27/14   Mellody DrownLauren Parker, PA-C  naproxen (NAPROSYN) 500 MG tablet Take 1 tablet (500 mg total) by mouth 2 (two) times daily. Patient not taking: Reported on 03/27/2014 11/24/13   Fayrene HelperBowie Tran, PA-C  ondansetron (ZOFRAN ODT) 4 MG disintegrating tablet 4mg  ODT q4 hours prn nausea/vomit 08/07/14   Judy Raceravid Yuka Lallier, MD  penicillin v potassium (VEETID) 500 MG tablet Take 1 tablet (500 mg total) by mouth 3 (three) times daily. Patient not taking: Reported on 03/27/2014 11/24/13   Fayrene HelperBowie Tran, PA-C  predniSONE (DELTASONE) 20 MG tablet Take 3 daily for 5 days, 2 daily for 5 days, 1 daily for 5 days. Patient not taking: Reported on 03/27/2014 01/06/14   Reuben Likesavid C Keller, MD  traMADol (ULTRAM) 50 MG tablet Take 1 tablet (50 mg total) by mouth every 6 (six) hours as needed. Patient not taking: Reported on 08/07/2014 04/07/14   Antony MaduraKelly Humes, PA-C   BP 134/86 mmHg  Pulse 107  Temp(Src) 98.6 F (37 C) (Oral)  Resp 16  SpO2 98%  LMP 07/21/2014 Physical Exam  Constitutional: She is oriented to person, place, and time. She appears well-developed and well-nourished. No  distress.  Tearful and anxious.  HENT:  Head: Normocephalic and atraumatic.  Mouth/Throat: Oropharynx is clear and moist.  Eyes: EOM are normal. Pupils are equal, round, and reactive to light.  Neck: Normal range of motion. Neck supple.  Cardiovascular: Normal rate and regular rhythm.   Pulmonary/Chest: Effort normal and breath sounds normal. No respiratory distress. She has no wheezes. She has no rales.  Abdominal: Soft. Bowel sounds are normal. She exhibits no distension and no mass. There is tenderness (mild diffuse tenderness especially in the suprapubic and left lower quadrant regions. No definite rebound or guarding.). There is no rebound and no guarding.  Musculoskeletal: Normal range of motion. She exhibits tenderness. She exhibits no edema.  Bilateral flank tenderness with percussion, right greater than left  Neurological: She is alert and oriented to person, place, and time.  Moves all extremities without deficit. Sensation is grossly intact  Skin: Skin is warm and dry.  No rash noted. No erythema.  Psychiatric: Her behavior is normal.  Nursing note and vitals reviewed.   ED Course  Procedures (including critical care time) DIAGNOSTIC STUDIES: Oxygen Saturation is 98% on room air, normal by my interpretation.    COORDINATION OF CARE: 11:26 PM-Discussed treatment plan which includes labs and IV fluids with pt at bedside and pt agreed to plan.   Labs Review Labs Reviewed  COMPREHENSIVE METABOLIC PANEL - Abnormal; Notable for the following:    AST 13 (*)    ALT 9 (*)    All other components within normal limits  URINALYSIS, ROUTINE W REFLEX MICROSCOPIC - Abnormal; Notable for the following:    APPearance CLOUDY (*)    Nitrite POSITIVE (*)    Leukocytes, UA SMALL (*)    All other components within normal limits  URINE MICROSCOPIC-ADD ON - Abnormal; Notable for the following:    Squamous Epithelial / LPF MANY (*)    Bacteria, UA MANY (*)    All other components within  normal limits  CBC WITH DIFFERENTIAL/PLATELET  LIPASE, BLOOD  I-STAT TROPOININ, ED  POC URINE PREG, ED    Imaging Review Dg Chest 2 View  08/07/2014   CLINICAL DATA:  Lower abdominal pain radiating to the back for 2 days. Tightness in the center of the chest. Nausea.  EXAM: CHEST  2 VIEW  COMPARISON:  01/06/2014  FINDINGS: The heart size and mediastinal contours are within normal limits. Both lungs are clear. The visualized skeletal structures are unremarkable.  IMPRESSION: No active cardiopulmonary disease.   Electronically Signed   By: Burman Nieves M.D.   On: 08/07/2014 01:27   US Transvaginal Non-ob  08/07/2014   CLINICAL DATA:  RIGHT lower quadrant pain. Last menstrual period July 21, 2014.  EXAM: TRANSABDOMINAL AND TRANSVAGINAL ULTRASOUND OF PELVIS  DOPPLER ULTRASOUND OF OVARIES  TECHNIQUE: Both transabdominal and transvaginal ultrasound examinations of the pelvis were performed. Transabdominal technique was performed for global imaging of the pelvis including uterus, ovaries, adnexal regions, and pelvic cul-de-sac.  It was necessary to proceed with endovaginal exam following the transabdominal exam to visualize the endometrium and ovaries. Color and duplex Doppler ultrasound was utilized to evaluate blood flow to the ovaries.  COMPARISON:  None.  FINDINGS: Uterus  Measurements: 6.4 x 3 x 4.4 cm. No fibroids or other mass visualized.  Endometrium  Thickness: 6.1 mm.  No focal abnormality visualized.  Right ovary  Measurements: 4.5 x 2.3 x 4.3 cm. Normal appearance/no adnexal mass.  Left ovary  Measurements: 3.1 x 2.3 x 2.9 cm. Normal appearance/no adnexal mass.  Pulsed Doppler evaluation of both ovaries demonstrates normal low-resistance arterial and venous waveforms.  Other findings  Echogenic debris in the urinary bladder.  IMPRESSION: Debris in urinary bladder most consistent with cystitis. Otherwise unremarkable pelvic ultrasound.   Electronically Signed   By: Awilda Metro   On:  08/07/2014 04:36   US Pelvis Complete  08/07/2014   CLINICAL DATA:  RIGHT lower quadrant pain. Last menstrual period July 21, 2014.  EXAM: TRANSABDOMINAL AND TRANSVAGINAL ULTRASOUND OF PELVIS  DOPPLER ULTRASOUND OF OVARIES  TECHNIQUE: Both transabdominal and transvaginal ultrasound examinations of the pelvis were performed. Transabdominal technique was performed for global imaging of the pelvis including uterus, ovaries, adnexal regions, and pelvic cul-de-sac.  It was necessary to proceed with endovaginal exam following the transabdominal exam to visualize the endometrium and ovaries. Color and duplex Doppler ultrasound was utilized to evaluate blood flow to the ovaries.  COMPARISON:  None.  FINDINGS: Uterus  Measurements: 6.4 x 3 x 4.4 cm. No fibroids or other mass visualized.  Endometrium  Thickness: 6.1 mm.  No focal abnormality visualized.  Right ovary  Measurements: 4.5 x 2.3 x 4.3 cm. Normal appearance/no adnexal mass.  Left ovary  Measurements: 3.1 x 2.3 x 2.9 cm. Normal appearance/no adnexal mass.  Pulsed Doppler evaluation of both ovaries demonstrates normal low-resistance arterial and venous waveforms.  Other findings  Echogenic debris in the urinary bladder.  IMPRESSION: Debris in urinary bladder most consistent with cystitis. Otherwise unremarkable pelvic ultrasound.   Electronically Signed   By: Awilda Metro   On: 08/07/2014 04:36   Ct Abdomen Pelvis W Contrast  08/07/2014   CLINICAL DATA:  Acute onset of generalized abdominal pain and back pain. Nausea. Initial encounter.  EXAM: CT ABDOMEN AND PELVIS WITH CONTRAST  TECHNIQUE: Multidetector CT imaging of the abdomen and pelvis was performed using the standard protocol following bolus administration of intravenous contrast.  CONTRAST:  OMNIPAQUE IOHEXOL 300 MG/ML  SOLN  COMPARISON:  None.  FINDINGS: The visualized lung bases are clear.  The liver and spleen are unremarkable in appearance. The gallbladder is within normal limits. The  pancreas and adrenal glands are unremarkable.  The kidneys are unremarkable in appearance. There is no evidence of hydronephrosis. No renal or ureteral stones are seen. No perinephric stranding is appreciated.  No free fluid is identified. The small bowel is unremarkable in appearance. The stomach is within normal limits. No acute vascular abnormalities are seen.  The appendix is borderline prominent, measuring up to 8 mm, without definite soft tissue inflammation to suggest appendicitis. The colon is grossly unremarkable in appearance.  The bladder is moderately distended and grossly unremarkable. The uterus is within normal limits. The ovaries are relatively symmetric. No suspicious adnexal masses are seen. No inguinal lymphadenopathy is seen.  No acute osseous abnormalities are identified.  IMPRESSION: Borderline prominence of the appendix, measuring up to 8 mm, without definite soft tissue inflammation to suggest appendicitis. This may reflect the patient's baseline. No acute abnormality seen within the abdomen or pelvis.   Electronically Signed   By: Roanna Raider M.D.   On: 08/07/2014 01:55   [place] Art/ven Flow Abd Pelv Doppler  08/07/2014   CLINICAL DATA:  RIGHT lower quadrant pain. Last menstrual period July 21, 2014.  EXAM: TRANSABDOMINAL AND TRANSVAGINAL ULTRASOUND OF PELVIS  DOPPLER ULTRASOUND OF OVARIES  TECHNIQUE: Both transabdominal and transvaginal ultrasound examinations of the pelvis were performed. Transabdominal technique was performed for global imaging of the pelvis including uterus, ovaries, adnexal regions, and pelvic cul-de-sac.  It was necessary to proceed with endovaginal exam following the transabdominal exam to visualize the endometrium and ovaries. Color and duplex Doppler ultrasound was utilized to evaluate blood flow to the ovaries.  COMPARISON:  None.  FINDINGS: Uterus  Measurements: 6.4 x 3 x 4.4 cm. No fibroids or other mass visualized.  Endometrium  Thickness: 6.1 mm.  No focal  abnormality visualized.  Right ovary  Measurements: 4.5 x 2.3 x 4.3 cm. Normal appearance/no adnexal mass.  Left ovary  Measurements: 3.1 x 2.3 x 2.9 cm. Normal appearance/no adnexal mass.  Pulsed Doppler evaluation of both ovaries demonstrates normal low-resistance arterial and venous waveforms.  Other findings  Echogenic debris in the urinary bladder.  IMPRESSION: Debris in urinary bladder most consistent with cystitis. Otherwise unremarkable pelvic ultrasound.   Electronically Signed   By: Awilda Metro   On: 08/07/2014 04:36     EKG Interpretation  Date/Time:  Friday Aug 06 2014 22:54:54 EDT Ventricular Rate:  105 PR Interval:  126 QRS Duration: 72 QT Interval:  320 QTC Calculation: 422 R Axis:   62 Text Interpretation:  Sinus tachycardia Possible Left atrial enlargement  Borderline ECG Confirmed by Ranae PalmsYELVERTON  MD, Seana Underwood ([id number]) on 08/06/2014  11:08:12 PM      MDM   Final diagnoses:  Flank pain  Lower abdominal pain  UTI (lower urinary tract infection)    I personally performed the services described in this documentation, which was scribed in my presence. The recorded information has been reviewed and is accurate.  Patient has no McBurney's point tenderness with no rebound or guarding. Normal white blood cell count. Patient with mildly prominent appendix on CT but without evidence to suggest appendicitis. Pelvic ultrasound consistent with cystitis but no tubo-ovarian abscess or torsion demonstrated. Patient is refusing pelvic exam. She states she's had ongoing abdominal pain that is chronic with multiple negative workups but has never seen a gastroenterologist. She is asking to be discharged home. She is aware that if the pain worsens or she has new symptoms that she needs to return immediately to the emergency department. We'll start on antibiotics for UTI.   Judy Raceravid Devante Capano, MD 08/07/14 [phone]

## 2014-08-07 ENCOUNTER — Emergency Department (HOSPITAL_COMMUNITY): Payer: Self-pay

## 2014-08-07 ENCOUNTER — Encounter (HOSPITAL_COMMUNITY): Payer: Self-pay | Admitting: Radiology

## 2014-08-07 LAB — URINALYSIS, ROUTINE W REFLEX MICROSCOPIC
BILIRUBIN URINE: NEGATIVE
Glucose, UA: NEGATIVE mg/dL
Hgb urine dipstick: NEGATIVE
Ketones, ur: NEGATIVE mg/dL
Nitrite: POSITIVE — AB
PROTEIN: NEGATIVE mg/dL
Specific Gravity, Urine: 1.009 (ref 1.005–1.030)
Urobilinogen, UA: 0.2 mg/dL (ref 0.0–1.0)
pH: 6 (ref 5.0–8.0)

## 2014-08-07 LAB — URINE MICROSCOPIC-ADD ON

## 2014-08-07 LAB — POC URINE PREG, ED: PREG TEST UR: NEGATIVE

## 2014-08-07 MED ORDER — ONDANSETRON 4 MG PO TBDP
ORAL_TABLET | ORAL | Status: DC
Start: 2014-08-07 — End: 2014-12-23

## 2014-08-07 MED ORDER — CEPHALEXIN 500 MG PO CAPS: 500.0000 mg | ORAL_CAPSULE | Freq: Two times a day (BID) | ORAL | Status: DC

## 2014-08-07 MED ORDER — MORPHINE SULFATE 2 MG/ML IJ SOLN
INTRAMUSCULAR | Status: AC
  Administered 2014-08-07: 4 mg via INTRAVENOUS
  Filled 2014-08-07: qty 2

## 2014-08-07 MED ORDER — MORPHINE SULFATE 4 MG/ML IJ SOLN: 4.0000 mg | Freq: Once | INTRAMUSCULAR | Status: AC

## 2014-08-07 MED ORDER — HYDROCODONE-ACETAMINOPHEN 5-325 MG PO TABS: 1.0000 | ORAL_TABLET | ORAL | Status: DC | PRN

## 2014-08-07 MED ORDER — IOHEXOL 300 MG/ML  SOLN
75.0000 mL | Freq: Once | INTRAMUSCULAR | Status: AC | PRN
  Administered 2014-08-07: 100 mL via INTRAVENOUS

## 2014-08-07 NOTE — ED Notes (Signed)
MD at bedside, pt refusing pelvic, states she wants to go home.

## 2014-08-07 NOTE — Discharge Instructions (Signed)
Abdominal Pain, Women °Abdominal (stomach, pelvic, or belly) pain can be caused by many things. It is important to tell your doctor: °· The location of the pain. °· Does it come and go or is it present all the time? °· Are there things that start the pain (eating certain foods, exercise)? °· Are there other symptoms associated with the pain (fever, nausea, vomiting, diarrhea)? °All of this is helpful to know when trying to find the cause of the pain. °CAUSES  °· Stomach: virus or bacteria infection, or ulcer. °· Intestine: appendicitis (inflamed appendix), regional ileitis (Crohn's disease), ulcerative colitis (inflamed colon), irritable bowel syndrome, diverticulitis (inflamed diverticulum of the colon), or cancer of the stomach or intestine. °· Gallbladder disease or stones in the gallbladder. °· Kidney disease, kidney stones, or infection. °· Pancreas infection or cancer. °· Fibromyalgia (pain disorder). °· Diseases of the female organs: °· Uterus: fibroid (non-cancerous) tumors or infection. °· Fallopian tubes: infection or tubal pregnancy. °· Ovary: cysts or tumors. °· Pelvic adhesions (scar tissue). °· Endometriosis (uterus lining tissue growing in the pelvis and on the pelvic organs). °· Pelvic congestion syndrome (female organs filling up with blood just before the menstrual period). °· Pain with the menstrual period. °· Pain with ovulation (producing an egg). °· Pain with an IUD (intrauterine device, birth control) in the uterus. °· Cancer of the female organs. °· Functional pain (pain not caused by a disease, may improve without treatment). °· Psychological pain. °· Depression. °DIAGNOSIS  °Your doctor will decide the seriousness of your pain by doing an examination. °· Blood tests. °· X-rays. °· Ultrasound. °· CT scan (computed tomography, special type of X-ray). °· MRI (magnetic resonance imaging). °· Cultures, for infection. °· Barium enema (dye inserted in the large intestine, to better view it with  X-rays). °· Colonoscopy (looking in intestine with a lighted tube). °· Laparoscopy (minor surgery, looking in abdomen with a lighted tube). °· Major abdominal exploratory surgery (looking in abdomen with a large incision). °TREATMENT  °The treatment will depend on the cause of the pain.  °· Many cases can be observed and treated at home. °· Over-the-counter medicines recommended by your caregiver. °· Prescription medicine. °· Antibiotics, for infection. °· Birth control pills, for painful periods or for ovulation pain. °· Hormone treatment, for endometriosis. °· Nerve blocking injections. °· Physical therapy. °· Antidepressants. °· Counseling with a psychologist or psychiatrist. °· Minor or major surgery. °HOME CARE INSTRUCTIONS  °· Do not take laxatives, unless directed by your caregiver. °· Take over-the-counter pain medicine only if ordered by your caregiver. Do not take aspirin because it can cause an upset stomach or bleeding. °· Try a clear liquid diet (broth or water) as ordered by your caregiver. Slowly move to a bland diet, as tolerated, if the pain is related to the stomach or intestine. °· Have a thermometer and take your temperature several times a day, and record it. °· Bed rest and sleep, if it helps the pain. °· Avoid sexual intercourse, if it causes pain. °· Avoid stressful situations. °· Keep your follow-up appointments and tests, as your caregiver orders. °· If the pain does not go away with medicine or surgery, you may try: °¨ Acupuncture. °¨ Relaxation exercises (yoga, meditation). °¨ Group therapy. °¨ Counseling. °SEEK MEDICAL CARE IF:  °· You notice certain foods cause stomach pain. °· Your home care treatment is not helping your pain. °· You need stronger pain medicine. °· You want your IUD removed. °· You feel faint or   lightheaded. °· You develop nausea and vomiting. °· You develop a rash. °· You are having side effects or an allergy to your medicine. °SEEK IMMEDIATE MEDICAL CARE IF:  °· Your  pain does not go away or gets worse. °· You have a fever. °· Your pain is felt only in portions of the abdomen. The right side could possibly be appendicitis. The left lower portion of the abdomen could be colitis or diverticulitis. °· You are passing blood in your stools (bright red or black tarry stools, with or without vomiting). °· You have blood in your urine. °· You develop chills, with or without a fever. °· You pass out. °MAKE SURE YOU:  °· Understand these instructions. °· Will watch your condition. °· Will get help right away if you are not doing well or get worse. °Document Released: 01/07/2007 Document Revised: 07/27/2013 Document Reviewed: 01/27/2009 °ExitCare® Patient Information ©2015 [employer]. This information is not intended to replace advice given to you by your health care provider. Make sure you discuss any questions you have with your health care provider. ° °Urinary Tract Infection °Urinary tract infections (UTIs) can develop anywhere along your urinary tract. Your urinary tract is your body's drainage system for removing wastes and extra water. Your urinary tract includes two kidneys, two ureters, a bladder, and a urethra. Your kidneys are a pair of bean-shaped organs. Each kidney is about the size of your fist. They are located below your ribs, one on each side of your spine. °CAUSES °Infections are caused by microbes, which are microscopic organisms, including fungi, viruses, and bacteria. These organisms are so small that they can only be seen through a microscope. Bacteria are the microbes that most commonly cause UTIs. °SYMPTOMS  °Symptoms of UTIs may vary by age and gender of the patient and by the location of the infection. Symptoms in young women typically include a frequent and intense urge to urinate and a painful, burning feeling in the bladder or urethra during urination. Older women and men are more likely to be tired, shaky, and weak and have muscle aches and abdominal pain.  A fever may mean the infection is in your kidneys. Other symptoms of a kidney infection include pain in your back or sides below the ribs, nausea, and vomiting. °DIAGNOSIS °To diagnose a UTI, your caregiver will ask you about your symptoms. Your caregiver also will ask to provide a urine sample. The urine sample will be tested for bacteria and white blood cells. White blood cells are made by your body to help fight infection. °TREATMENT  °Typically, UTIs can be treated with medication. Because most UTIs are caused by a bacterial infection, they usually can be treated with the use of antibiotics. The choice of antibiotic and length of treatment depend on your symptoms and the type of bacteria causing your infection. °HOME CARE INSTRUCTIONS °· If you were prescribed antibiotics, take them exactly as your caregiver instructs you. Finish the medication even if you feel better after you have only taken some of the medication. °· Drink enough water and fluids to keep your urine clear or pale yellow. °· Avoid caffeine, tea, and carbonated beverages. They tend to irritate your bladder. °· Empty your bladder often. Avoid holding urine for long periods of time. °· Empty your bladder before and after sexual intercourse. °· After a bowel movement, women should cleanse from front to back. Use each tissue only once. °SEEK MEDICAL CARE IF:  °· You have back pain. °· You develop   a fever. °· Your symptoms do not begin to resolve within 3 days. °SEEK IMMEDIATE MEDICAL CARE IF:  °· You have severe back pain or lower abdominal pain. °· You develop chills. °· You have nausea or vomiting. °· You have continued burning or discomfort with urination. °MAKE SURE YOU:  °· Understand these instructions. °· Will watch your condition. °· Will get help right away if you are not doing well or get worse. °Document Released: 12/20/2004 Document Revised: 09/11/2011 Document Reviewed: 04/20/2011 °ExitCare® Patient Information ©2015 [employer]. This  information is not intended to replace advice given to you by your health care provider. Make sure you discuss any questions you have with your health care provider. ° °

## 2014-08-07 NOTE — ED Notes (Signed)
POC UPREG NEGATIVE

## 2014-09-10 ENCOUNTER — Emergency Department (HOSPITAL_COMMUNITY)
Admission: EM | Admit: 2014-09-10 | Discharge: 2014-09-11 | Disposition: A | Discharge status: Home / Self Care | Payer: Self-pay | Attending: Emergency Medicine | Admitting: Emergency Medicine

## 2014-09-10 ENCOUNTER — Encounter (HOSPITAL_COMMUNITY): Payer: Self-pay | Admitting: *Deleted

## 2014-09-10 DIAGNOSIS — Z3202 Encounter for pregnancy test, result negative: Secondary | ICD-10-CM | POA: Insufficient documentation

## 2014-09-10 DIAGNOSIS — R197 Diarrhea, unspecified: Secondary | ICD-10-CM | POA: Insufficient documentation

## 2014-09-10 DIAGNOSIS — Z720 Tobacco use: Secondary | ICD-10-CM | POA: Insufficient documentation

## 2014-09-10 DIAGNOSIS — R112 Nausea with vomiting, unspecified: Secondary | ICD-10-CM | POA: Insufficient documentation

## 2014-09-10 DIAGNOSIS — N390 Urinary tract infection, site not specified: Secondary | ICD-10-CM | POA: Insufficient documentation

## 2014-09-10 DIAGNOSIS — Z79899 Other long term (current) drug therapy: Secondary | ICD-10-CM | POA: Insufficient documentation

## 2014-09-10 LAB — CBC WITH DIFFERENTIAL/PLATELET
BASOS PCT: 1 % (ref 0–1)
Basophils Absolute: 0.1 10*3/uL (ref 0.0–0.1)
Eosinophils Absolute: 0.3 10*3/uL (ref 0.0–0.7)
Eosinophils Relative: 5 % (ref 0–5)
HEMATOCRIT: 36.5 % (ref 36.0–46.0)
HEMOGLOBIN: 12.6 g/dL (ref 12.0–15.0)
LYMPHS PCT: 40 % (ref 12–46)
Lymphs Abs: 2.4 10*3/uL (ref 0.7–4.0)
MCH: 29 pg (ref 26.0–34.0)
MCHC: 34.5 g/dL (ref 30.0–36.0)
MCV: 84.1 fL (ref 78.0–100.0)
MONOS PCT: 7 % (ref 3–12)
Monocytes Absolute: 0.4 10*3/uL (ref 0.1–1.0)
NEUTROS ABS: 2.8 10*3/uL (ref 1.7–7.7)
Neutrophils Relative %: 47 % (ref 43–77)
Platelets: 313 10*3/uL (ref 150–400)
RBC: 4.34 MIL/uL (ref 3.87–5.11)
RDW: 13.3 % (ref 11.5–15.5)
WBC: 5.9 10*3/uL (ref 4.0–10.5)

## 2014-09-10 LAB — POC URINE PREG, ED: PREG TEST UR: NEGATIVE

## 2014-09-10 NOTE — ED Notes (Signed)
The pt is c/o lower abd pain for one week  lmp April 1st until today.  n v   Vaginal bleeding at present

## 2014-09-11 ENCOUNTER — Emergency Department (HOSPITAL_COMMUNITY): Payer: Self-pay

## 2014-09-11 LAB — COMPREHENSIVE METABOLIC PANEL
ALBUMIN: 3.7 g/dL (ref 3.5–5.0)
ALT: 8 U/L — ABNORMAL LOW (ref 14–54)
AST: 17 U/L (ref 15–41)
Alkaline Phosphatase: 64 U/L (ref 38–126)
Anion gap: 9 (ref 5–15)
BILIRUBIN TOTAL: 0.5 mg/dL (ref 0.3–1.2)
BUN: 8 mg/dL (ref 6–20)
CO2: 23 mmol/L (ref 22–32)
Calcium: 8.8 mg/dL — ABNORMAL LOW (ref 8.9–10.3)
Chloride: 107 mmol/L (ref 101–111)
Creatinine, Ser: 0.73 mg/dL (ref 0.44–1.00)
GFR calc Af Amer: >60 mL/min (ref >60)
GFR calc non Af Amer: >60 mL/min (ref >60)
GLUCOSE: 113 mg/dL — AB (ref 65–99)
POTASSIUM: 3.4 mmol/L — AB (ref 3.5–5.1)
SODIUM: 139 mmol/L (ref 135–145)
Total Protein: 6.4 g/dL — ABNORMAL LOW (ref 6.5–8.1)

## 2014-09-11 LAB — URINALYSIS, ROUTINE W REFLEX MICROSCOPIC
Bilirubin Urine: NEGATIVE
GLUCOSE, UA: NEGATIVE mg/dL
KETONES UR: NEGATIVE mg/dL
LEUKOCYTES UA: NEGATIVE
Nitrite: NEGATIVE
Protein, ur: NEGATIVE mg/dL
Specific Gravity, Urine: 1.031 — ABNORMAL HIGH (ref 1.005–1.030)
Urobilinogen, UA: 1 mg/dL (ref 0.0–1.0)
pH: 6 (ref 5.0–8.0)

## 2014-09-11 LAB — URINE MICROSCOPIC-ADD ON

## 2014-09-11 LAB — LIPASE, BLOOD: Lipase: 22 U/L (ref 22–51)

## 2014-09-11 MED ORDER — FENTANYL CITRATE (PF) 100 MCG/2ML IJ SOLN
100.0000 ug | Freq: Once | INTRAMUSCULAR | Status: AC
  Administered 2014-09-11: 100 ug via INTRAVENOUS
  Filled 2014-09-11: qty 2

## 2014-09-11 MED ORDER — CEPHALEXIN 500 MG PO CAPS: 500.0000 mg | ORAL_CAPSULE | Freq: Three times a day (TID) | ORAL | Status: DC

## 2014-09-11 MED ORDER — TRAMADOL HCL 50 MG PO TABS: 50.0000 mg | ORAL_TABLET | Freq: Four times a day (QID) | ORAL | Status: DC | PRN

## 2014-09-11 MED ORDER — TRAMADOL HCL 50 MG PO TABS
50.0000 mg | ORAL_TABLET | Freq: Four times a day (QID) | ORAL | Status: DC | PRN
Start: 2014-09-11 — End: 2014-12-23

## 2014-09-11 MED ORDER — MORPHINE SULFATE 4 MG/ML IJ SOLN: 4.0000 mg | Freq: Once | INTRAMUSCULAR | Status: DC

## 2014-09-11 MED ORDER — DEXTROSE 5 % IV SOLN
1.0000 g | Freq: Once | INTRAVENOUS | Status: AC
  Administered 2014-09-11: 1 g via INTRAVENOUS
  Filled 2014-09-11: qty 10

## 2014-09-11 MED ORDER — SODIUM CHLORIDE 0.9 % IV BOLUS (SEPSIS)
1000.0000 mL | Freq: Once | INTRAVENOUS | Status: AC
  Administered 2014-09-11: 1000 mL via INTRAVENOUS

## 2014-09-11 MED ORDER — TRAMADOL HCL 50 MG PO TABS
50.0000 mg | ORAL_TABLET | Freq: Once | ORAL | Status: AC
  Administered 2014-09-11: 50 mg via ORAL
  Filled 2014-09-11: qty 1

## 2014-09-11 MED ORDER — ONDANSETRON HCL 4 MG/2ML IJ SOLN
4.0000 mg | INTRAMUSCULAR | Status: AC
  Administered 2014-09-11: 4 mg via INTRAVENOUS
  Filled 2014-09-11: qty 2

## 2014-09-11 NOTE — ED Provider Notes (Signed)
CSN: [id number]     Arrival date & time 09/10/14  2248 History   First MD Initiated Contact with Patient 09/10/14 2352     Chief Complaint  Patient presents with  . Abdominal Pain     (Consider location/radiation/quality/duration/timing/severity/associated sxs/prior Treatment) HPI Comments: Patient is a 24 yo F presenting to the ED for bilateral flank pain and kidney pain with associated nausea, multiple episodes of non-bloody vomiting, diarrhea x 1.   Patient is a 24 y.o. female presenting with abdominal pain. The history is provided by the patient.  Abdominal Pain Pain location:  LLQ and RLQ Pain quality: sharp   Pain radiates to:  L flank and R flank Pain severity:  Severe Onset quality:  Sudden Duration:  1 week Progression:  Worsening Chronicity:  New Relieved by:  Nothing Worsened by:  Nothing tried Ineffective treatments:  None tried Associated symptoms: diarrhea, nausea, vaginal bleeding (menstrual cycle) and vomiting     History reviewed. No pertinent past medical history. Past Surgical History  Procedure Laterality Date  . Right hand surgery     . Right wrist surgery     Family History  Problem Relation Age of Onset  . Hypertension Father   . Cancer Paternal Grandfather     skin cancer    History  Substance Use Topics  . Smoking status: Current Every Day Smoker -- 0.50 packs/day for 5 years    Types: Cigarettes  . Smokeless tobacco: Never Used     Comment: about 5 cigarettes a day  . Alcohol Use: No   OB History    Gravida Para Term Preterm AB TAB SAB Ectopic Multiple Living   0              Review of Systems  Gastrointestinal: Positive for nausea, vomiting, abdominal pain and diarrhea.  Genitourinary: Positive for flank pain and vaginal bleeding (menstrual cycle).  All other systems reviewed and are negative.     Allergies  Review of patient's allergies indicates no known allergies.  Home Medications   Prior to Admission medications    Medication Sig Start Date End Date Taking? Authorizing Provider  ibuprofen (ADVIL,MOTRIN) 200 MG tablet Take 200 mg by mouth every 6 (six) hours as needed for mild pain.   Yes Historical Provider, MD  albuterol (PROVENTIL HFA;VENTOLIN HFA) 108 (90 BASE) MCG/ACT inhaler Inhale 2 puffs into the lungs 4 (four) times daily. Patient not taking: Reported on 03/27/2014 01/06/14   Reuben Likes, MD  cephALEXin (KEFLEX) 500 MG capsule Take 1 capsule (500 mg total) by mouth 2 (two) times daily. Patient not taking: Reported on 09/11/2014 08/07/14   Loren Racer, MD  cephALEXin (KEFLEX) 500 MG capsule Take 1 capsule (500 mg total) by mouth 3 (three) times daily. 09/11/14   Arish Redner, PA-C  guaiFENesin-codeine (ROBITUSSIN AC) 100-10 MG/5ML syrup Take 5 mLs by mouth 3 (three) times daily as needed for cough. Patient not taking: Reported on 03/27/2014 01/06/14   Reuben Likes, MD  HYDROcodone-acetaminophen (NORCO/VICODIN) 5-325 MG per tablet Take 1 tablet by mouth every 4 (four) hours as needed for moderate pain or severe pain. Patient not taking: Reported on 09/11/2014 08/07/14   Loren Racer, MD  ibuprofen (ADVIL,MOTRIN) 400 MG tablet Take 1 tablet (400 mg total) by mouth every 6 (six) hours as needed. Patient not taking: Reported on 08/07/2014 03/27/14   Mellody Drown, PA-C  metroNIDAZOLE (FLAGYL) 500 MG tablet Take 1 tablet (500 mg total) by mouth 2 (two) times daily. Patient  not taking: Reported on 08/07/2014 03/27/14   Mellody Drown, PA-C  naproxen (NAPROSYN) 500 MG tablet Take 1 tablet (500 mg total) by mouth 2 (two) times daily. Patient not taking: Reported on 03/27/2014 11/24/13   Fayrene Helper, PA-C  ondansetron (ZOFRAN ODT) 4 MG disintegrating tablet  ODT q4 hours prn nausea/vomit Patient not taking: Reported on 09/11/2014 08/07/14   Loren Racer, MD  penicillin v potassium (VEETID) 500 MG tablet Take 1 tablet (500 mg total) by mouth 3 (three) times daily. Patient not taking: Reported on 03/27/2014  11/24/13   Fayrene Helper, PA-C  predniSONE (DELTASONE) 20 MG tablet Take 3 daily for 5 days, 2 daily for 5 days, 1 daily for 5 days. Patient not taking: Reported on 03/27/2014 01/06/14   Reuben Likes, MD  traMADol (ULTRAM) 50 MG tablet Take 1 tablet (50 mg total) by mouth every 6 (six) hours as needed. Patient not taking: Reported on 08/07/2014 04/07/14   Antony Madura, PA-C  traMADol (ULTRAM) 50 MG tablet Take 1 tablet (50 mg total) by mouth every 6 (six) hours as needed. 09/11/14   Anina Schnake, PA-C   BP 96/56 mmHg  Pulse 66  Temp(Src) 97.3 F (36.3 C) (Oral)  Resp 16  Ht  (1.473 m)  SpO2 97%  LMP 06/25/2014 Physical Exam  Constitutional: She is oriented to person, place, and time. She appears well-developed and well-nourished. No distress.  HENT:  Head: Normocephalic and atraumatic.  Right Ear: External ear normal.  Left Ear: External ear normal.  Nose: Nose normal.  Eyes: Conjunctivae are normal.  Neck: Neck supple.  Cardiovascular: Normal rate, regular rhythm and normal heart sounds.   Pulmonary/Chest: Effort normal and breath sounds normal.  Abdominal: Soft. Bowel sounds are normal. She exhibits no distension. There is tenderness in the right lower quadrant, suprapubic area and left lower quadrant. There is CVA tenderness. There is no rebound and no guarding.  Neurological: She is alert and oriented to person, place, and time.  Skin: Skin is warm and dry.  Nursing note and vitals reviewed.   ED Course  Procedures (including critical care time) Medications  sodium chloride 0.9 % bolus 1,000 mL (0 mLs Intravenous Stopped 09/11/14 0218)  ondansetron (ZOFRAN) injection 4 mg (4 mg Intravenous Given 09/11/14 0127)  cefTRIAXone (ROCEPHIN) 1 g in dextrose 5 % 50 mL IVPB (0 g Intravenous Stopped 09/11/14 0218)  fentaNYL (SUBLIMAZE) injection 100 mcg (100 mcg Intravenous Given 09/11/14 0129)  traMADol (ULTRAM) tablet 50 mg (50 mg Oral Given 09/11/14 0401)    Labs Review Labs  Reviewed  COMPREHENSIVE METABOLIC PANEL - Abnormal; Notable for the following:    Potassium 3.4 (*)    Glucose, Bld 113 (*)    Calcium 8.8 (*)    Total Protein 6.4 (*)    ALT 8 (*)    All other components within normal limits  URINALYSIS, ROUTINE W REFLEX MICROSCOPIC (NOT AT [facility]) - Abnormal; Notable for the following:    Color, Urine AMBER (*)    Specific Gravity, Urine 1.031 (*)    Hgb urine dipstick LARGE (*)    All other components within normal limits  URINE MICROSCOPIC-ADD ON - Abnormal; Notable for the following:    Squamous Epithelial / LPF FEW (*)    Bacteria, UA FEW (*)    Crystals CA OXALATE CRYSTALS (*)    All other components within normal limits  URINE CULTURE  CBC WITH DIFFERENTIAL/PLATELET  LIPASE, BLOOD  POC URINE PREG, ED  Imaging Review US Renal  09/11/2014   CLINICAL DATA:  Abdominal pain  EXAM: RENAL / URINARY TRACT ULTRASOUND COMPLETE  COMPARISON:  CT abdomen and pelvis 08/07/2014  FINDINGS: Right Kidney:  Length: 11 cm. Echogenicity within normal limits. No mass or hydronephrosis visualized.  Left Kidney:  Length: 10.7 cm. Echogenicity within normal limits. No mass or hydronephrosis visualized.  Bladder:  Appears normal for degree of bladder distention.  IMPRESSION: Normal ultrasound appearance of the kidneys and bladder.   Electronically Signed   By: Burman Nieves M.D.   On: 09/11/2014 03:14     EKG Interpretation None      MDM   Final diagnoses:  UTI (lower urinary tract infection)    Filed Vitals:   09/11/14 0442  BP: 96/56  Pulse: 66  Temp: 97.3 F (36.3 C)  Resp: 16   Afebrile, NAD, non-toxic appearing, AAOx4.  I have reviewed nursing notes, vital signs, and all appropriate lab and imaging results if ordered as above.   Patient is nontoxic, nonseptic appearing, in no apparent distress.  Patient's pain and other symptoms adequately managed in emergency department.  Fluid bolus given.  Labs, imaging and vitals reviewed.  Patient does  not meet the SIRS or Sepsis criteria.  On repeat exam patient does not have a surgical abdomin and there are no peritoneal signs.  No indication of appendicitis, bowel obstruction, bowel perforation, cholecystitis, diverticulitis, PID or ectopic pregnancy.Pt has been diagnosed with a UTI. Pt is afebrile, CVA tenderness present, slightly hypotensive, but asymptomatic otherwise vitals hemodynamically stable, able to tolerate PO intake without emesis. [place] reviewed without acute abnormality. . Pt to be dc home with antibiotics and instructions to follow up with PCP if symptoms persist. I have also discussed reasons to return immediately to the ER.  Patient expresses understanding and agrees with plan.       Francee Piccolo, PA-C 09/11/14 1308  Marisa Severin, MD 09/13/14 1352

## 2014-09-11 NOTE — Discharge Instructions (Signed)
Please follow up with your primary care physician in 1-2 days. If you do not have one please call the [facility] number listed above. Please take your antibiotic until completion. Please take pain medication and/or muscle relaxants as prescribed and as needed for pain. Please do not drive on narcotic pain medication or on muscle relaxants. Please read all discharge instructions and return precautions.  ° ° °Urinary Tract Infection °Urinary tract infections (UTIs) can develop anywhere along your urinary tract. Your urinary tract is your body's drainage system for removing wastes and extra water. Your urinary tract includes two kidneys, two ureters, a bladder, and a urethra. Your kidneys are a pair of bean-shaped organs. Each kidney is about the size of your fist. They are located below your ribs, one on each side of your spine. °CAUSES °Infections are caused by microbes, which are microscopic organisms, including fungi, viruses, and bacteria. These organisms are so small that they can only be seen through a microscope. Bacteria are the microbes that most commonly cause UTIs. °SYMPTOMS  °Symptoms of UTIs may vary by age and gender of the patient and by the location of the infection. Symptoms in young women typically include a frequent and intense urge to urinate and a painful, burning feeling in the bladder or urethra during urination. Older women and men are more likely to be tired, shaky, and weak and have muscle aches and abdominal pain. A fever may mean the infection is in your kidneys. Other symptoms of a kidney infection include pain in your back or sides below the ribs, nausea, and vomiting. °DIAGNOSIS °To diagnose a UTI, your caregiver will ask you about your symptoms. Your caregiver also will ask to provide a urine sample. The urine sample will be tested for bacteria and white blood cells. White blood cells are made by your body to help fight infection. °TREATMENT  °Typically, UTIs can be  treated with medication. Because most UTIs are caused by a bacterial infection, they usually can be treated with the use of antibiotics. The choice of antibiotic and length of treatment depend on your symptoms and the type of bacteria causing your infection. °HOME CARE INSTRUCTIONS °· If you were prescribed antibiotics, take them exactly as your caregiver instructs you. Finish the medication even if you feel better after you have only taken some of the medication. °· Drink enough water and fluids to keep your urine clear or pale yellow. °· Avoid caffeine, tea, and carbonated beverages. They tend to irritate your bladder. °· Empty your bladder often. Avoid holding urine for long periods of time. °· Empty your bladder before and after sexual intercourse. °· After a bowel movement, women should cleanse from front to back. Use each tissue only once. °SEEK MEDICAL CARE IF:  °· You have back pain. °· You develop a fever. °· Your symptoms do not begin to resolve within 3 days. °SEEK IMMEDIATE MEDICAL CARE IF:  °· You have severe back pain or lower abdominal pain. °· You develop chills. °· You have nausea or vomiting. °· You have continued burning or discomfort with urination. °MAKE SURE YOU:  °· Understand these instructions. °· Will watch your condition. °· Will get help right away if you are not doing well or get worse. °Document Released: 12/20/2004 Document Revised: 09/11/2011 Document Reviewed: 04/20/2011 °[employer]® Patient Information ©2015 [employer]. This information is not intended to replace advice given to you by your health care provider. Make sure you discuss any questions you have with   your health care provider. ° ° ° ° °

## 2014-09-12 LAB — URINE CULTURE

## 2014-12-20 ENCOUNTER — Encounter (HOSPITAL_COMMUNITY): Payer: Self-pay | Admitting: Emergency Medicine

## 2014-12-20 DIAGNOSIS — Z720 Tobacco use: Secondary | ICD-10-CM | POA: Insufficient documentation

## 2014-12-20 DIAGNOSIS — N390 Urinary tract infection, site not specified: Secondary | ICD-10-CM | POA: Insufficient documentation

## 2014-12-20 DIAGNOSIS — Z79899 Other long term (current) drug therapy: Secondary | ICD-10-CM | POA: Insufficient documentation

## 2014-12-20 DIAGNOSIS — Z3202 Encounter for pregnancy test, result negative: Secondary | ICD-10-CM | POA: Insufficient documentation

## 2014-12-20 LAB — COMPREHENSIVE METABOLIC PANEL
ALT: 10 U/L — ABNORMAL LOW (ref 14–54)
AST: 16 U/L (ref 15–41)
Albumin: 4 g/dL (ref 3.5–5.0)
Alkaline Phosphatase: 72 U/L (ref 38–126)
Anion gap: 6 (ref 5–15)
BILIRUBIN TOTAL: 0.6 mg/dL (ref 0.3–1.2)
BUN: 9 mg/dL (ref 6–20)
CHLORIDE: 102 mmol/L (ref 101–111)
CO2: 28 mmol/L (ref 22–32)
CREATININE: 0.98 mg/dL (ref 0.44–1.00)
Calcium: 8.8 mg/dL — ABNORMAL LOW (ref 8.9–10.3)
Glucose, Bld: 89 mg/dL (ref 65–99)
POTASSIUM: 4 mmol/L (ref 3.5–5.1)
Sodium: 136 mmol/L (ref 135–145)
Total Protein: 6.5 g/dL (ref 6.5–8.1)

## 2014-12-20 LAB — LIPASE, BLOOD: LIPASE: 21 U/L — AB (ref 22–51)

## 2014-12-20 LAB — CBC
HCT: 41.7 % (ref 36.0–46.0)
Hemoglobin: 14.1 g/dL (ref 12.0–15.0)
MCH: 29.2 pg (ref 26.0–34.0)
MCHC: 33.8 g/dL (ref 30.0–36.0)
MCV: 86.3 fL (ref 78.0–100.0)
Platelets: 322 10*3/uL (ref 150–400)
RBC: 4.83 MIL/uL (ref 3.87–5.11)
RDW: 12.7 % (ref 11.5–15.5)
WBC: 7.4 10*3/uL (ref 4.0–10.5)

## 2014-12-20 NOTE — ED Notes (Signed)
Pt. reports lower abdominal pain with nausea , dysuria and emesis onset last week, denies diarrhea / no fever or chills.

## 2014-12-21 ENCOUNTER — Emergency Department (HOSPITAL_COMMUNITY)
Admission: EM | Admit: 2014-12-21 | Discharge: 2014-12-21 | Disposition: A | Discharge status: Home / Self Care | Payer: Self-pay | Attending: Emergency Medicine | Admitting: Emergency Medicine

## 2014-12-21 DIAGNOSIS — N390 Urinary tract infection, site not specified: Secondary | ICD-10-CM

## 2014-12-21 LAB — URINALYSIS, ROUTINE W REFLEX MICROSCOPIC
Bilirubin Urine: NEGATIVE
Glucose, UA: NEGATIVE mg/dL
HGB URINE DIPSTICK: NEGATIVE
Ketones, ur: NEGATIVE mg/dL
Nitrite: POSITIVE — AB
PROTEIN: NEGATIVE mg/dL
Specific Gravity, Urine: 1.026 (ref 1.005–1.030)
UROBILINOGEN UA: 1 mg/dL (ref 0.0–1.0)
pH: 6 (ref 5.0–8.0)

## 2014-12-21 LAB — URINE MICROSCOPIC-ADD ON

## 2014-12-21 LAB — POC URINE PREG, ED: PREG TEST UR: NEGATIVE

## 2014-12-21 MED ORDER — HYDROCODONE-ACETAMINOPHEN 5-325 MG PO TABS
1.0000 | ORAL_TABLET | Freq: Once | ORAL | Status: AC
  Administered 2014-12-21: 1 via ORAL
  Filled 2014-12-21: qty 1

## 2014-12-21 MED ORDER — CEPHALEXIN 250 MG PO CAPS
500.0000 mg | ORAL_CAPSULE | Freq: Once | ORAL | Status: AC
  Administered 2014-12-21: 500 mg via ORAL
  Filled 2014-12-21: qty 2

## 2014-12-21 MED ORDER — PHENAZOPYRIDINE HCL 200 MG PO TABS: 200.0000 mg | ORAL_TABLET | Freq: Three times a day (TID) | ORAL | Status: DC

## 2014-12-21 MED ORDER — PHENAZOPYRIDINE HCL 100 MG PO TABS
200.0000 mg | ORAL_TABLET | Freq: Once | ORAL | Status: AC
  Administered 2014-12-21: 200 mg via ORAL
  Filled 2014-12-21: qty 2

## 2014-12-21 MED ORDER — CEPHALEXIN 500 MG PO CAPS: 500.0000 mg | ORAL_CAPSULE | Freq: Four times a day (QID) | ORAL | Status: DC

## 2014-12-21 NOTE — Discharge Instructions (Signed)

## 2014-12-21 NOTE — ED Provider Notes (Signed)
CSN: [id number]     Arrival date & time 12/20/14  2306 History   First MD Initiated Contact with Patient 12/21/14 0102     Chief Complaint  Patient presents with  . Abdominal Pain     (Consider location/radiation/quality/duration/timing/severity/associated sxs/prior Treatment) Patient is a 24 y.o. female presenting with abdominal pain. The history is provided by the patient. No language interpreter was used.  Abdominal Pain Pain location:  Suprapubic Pain quality: cramping and pressure   Associated symptoms: dysuria, nausea and vomiting   Associated symptoms: no chills, no fever and no hematuria   Associated symptoms comment:  Suprapubic abdominal pain and dysuria for one week. No fever. She reports bilateral back pain. She has had nausea with one episode of vomiting today. No vaginal discharge or upper abdominal pain.   History reviewed. No pertinent past medical history. Past Surgical History  Procedure Laterality Date  . Right hand surgery     . Right wrist surgery     Family History  Problem Relation Age of Onset  . Hypertension Father   . Cancer Paternal Grandfather     skin cancer    Social History  Substance Use Topics  . Smoking status: Current Every Day Smoker -- 0.00 packs/day for 0 years    Types: Cigarettes  . Smokeless tobacco: Never Used     Comment: about 5 cigarettes a day  . Alcohol Use: No   OB History    Gravida Para Term Preterm AB TAB SAB Ectopic Multiple Living   0              Review of Systems  Constitutional: Negative for fever and chills.  Gastrointestinal: Positive for nausea, vomiting and abdominal pain.  Genitourinary: Positive for dysuria. Negative for hematuria and difficulty urinating.  Musculoskeletal: Positive for back pain.  Skin: Negative.   Neurological: Negative.       Allergies  Review of patient's allergies indicates no known allergies.  Home Medications   Prior to Admission medications   Medication Sig Start Date  End Date Taking? Authorizing Provider  albuterol (PROVENTIL HFA;VENTOLIN HFA) 108 (90 BASE) MCG/ACT inhaler Inhale 2 puffs into the lungs 4 (four) times daily. Patient not taking: Reported on 03/27/2014 01/06/14   Reuben Likes, MD  cephALEXin (KEFLEX) 500 MG capsule Take 1 capsule (500 mg total) by mouth 2 (two) times daily. Patient not taking: Reported on 09/11/2014 08/07/14   Loren Racer, MD  cephALEXin (KEFLEX) 500 MG capsule Take 1 capsule (500 mg total) by mouth 3 (three) times daily. Patient not taking: Reported on 12/21/2014 09/11/14   Francee Piccolo, PA-C  guaiFENesin-codeine (ROBITUSSIN AC) 100-10 MG/5ML syrup Take 5 mLs by mouth 3 (three) times daily as needed for cough. Patient not taking: Reported on 03/27/2014 01/06/14   Reuben Likes, MD  HYDROcodone-acetaminophen (NORCO/VICODIN) 5-325 MG per tablet Take 1 tablet by mouth every 4 (four) hours as needed for moderate pain or severe pain. Patient not taking: Reported on 09/11/2014 08/07/14   Loren Racer, MD  ibuprofen (ADVIL,MOTRIN) 400 MG tablet Take 1 tablet (400 mg total) by mouth every 6 (six) hours as needed. Patient not taking: Reported on 08/07/2014 03/27/14   Mellody Drown, PA-C  metroNIDAZOLE (FLAGYL) 500 MG tablet Take 1 tablet (500 mg total) by mouth 2 (two) times daily. Patient not taking: Reported on 08/07/2014 03/27/14   Mellody Drown, PA-C  naproxen (NAPROSYN) 500 MG tablet Take 1 tablet (500 mg total) by mouth 2 (two) times daily. Patient not  taking: Reported on 03/27/2014 11/24/13   Fayrene Helper, PA-C  ondansetron (ZOFRAN ODT) 4 MG disintegrating tablet  ODT q4 hours prn nausea/vomit Patient not taking: Reported on 09/11/2014 08/07/14   Loren Racer, MD  penicillin v potassium (VEETID) 500 MG tablet Take 1 tablet (500 mg total) by mouth 3 (three) times daily. Patient not taking: Reported on 03/27/2014 11/24/13   Fayrene Helper, PA-C  predniSONE (DELTASONE) 20 MG tablet Take 3 daily for 5 days, 2 daily for 5 days, 1 daily for 5  days. Patient not taking: Reported on 03/27/2014 01/06/14   Reuben Likes, MD  traMADol (ULTRAM) 50 MG tablet Take 1 tablet (50 mg total) by mouth every 6 (six) hours as needed. Patient not taking: Reported on 08/07/2014 04/07/14   Antony Madura, PA-C  traMADol (ULTRAM) 50 MG tablet Take 1 tablet (50 mg total) by mouth every 6 (six) hours as needed. Patient not taking: Reported on 12/21/2014 09/11/14   Victorino Dike Piepenbrink, PA-C   BP 115/60 mmHg  Pulse 86  Temp(Src) 98.4 F (36.9 C) (Oral)  Resp 16  Ht  (1.448 m)  Wt 92 lb (41.731 kg)  BMI 19.90 kg/m2  SpO2 98%  LMP 11/21/2014 Physical Exam  Constitutional: She is oriented to person, place, and time. She appears well-developed and well-nourished.  Tearful.  Neck: Normal range of motion.  Pulmonary/Chest: Effort normal.  Abdominal: She exhibits no mass. There is tenderness. There is no guarding.  Suprapubic tenderness. No upper abdominal tenderness.   Genitourinary:  Right greater than left back tenderness without specific CVA tenderness.   Neurological: She is alert and oriented to person, place, and time.  Skin: Skin is warm and dry.    ED Course  Procedures (including critical care time) Labs Review Labs Reviewed  LIPASE, BLOOD - Abnormal; Notable for the following:    Lipase 21 (*)    All other components within normal limits  COMPREHENSIVE METABOLIC PANEL - Abnormal; Notable for the following:    Calcium 8.8 (*)    ALT 10 (*)    All other components within normal limits  URINALYSIS, ROUTINE W REFLEX MICROSCOPIC (NOT AT [facility]) - Abnormal; Notable for the following:    APPearance CLOUDY (*)    Nitrite POSITIVE (*)    Leukocytes, UA LARGE (*)    All other components within normal limits  URINE MICROSCOPIC-ADD ON - Abnormal; Notable for the following:    Squamous Epithelial / LPF FEW (*)    Bacteria, UA MANY (*)    All other components within normal limits  CBC  POC URINE PREG, ED    Imaging Review No results  found. I have personally reviewed and evaluated these images and lab results as part of my medical decision-making.   EKG Interpretation None      MDM   Final diagnoses:  None    1. UTI  Patient with symptoms familiar to her as UTI with lab findings of urinary tract infection. Normal vitals, non-toxic in appearance. Bilateral back tenderness without CVA tenderness - doubt pyelonephritis. Will treat with Keflex, Pyridium. Return precautions discussed.    Elpidio Anis, PA-C 12/21/14 0117  April Palumbo, MD 12/21/14 [phone]

## 2014-12-23 ENCOUNTER — Emergency Department (HOSPITAL_COMMUNITY)
Admission: EM | Admit: 2014-12-23 | Discharge: 2014-12-24 | Disposition: A | Discharge status: Home / Self Care | Payer: Self-pay | Attending: Emergency Medicine | Admitting: Emergency Medicine

## 2014-12-23 ENCOUNTER — Encounter (HOSPITAL_COMMUNITY): Payer: Self-pay | Admitting: Emergency Medicine

## 2014-12-23 DIAGNOSIS — Z79899 Other long term (current) drug therapy: Secondary | ICD-10-CM | POA: Insufficient documentation

## 2014-12-23 DIAGNOSIS — Z3202 Encounter for pregnancy test, result negative: Secondary | ICD-10-CM | POA: Insufficient documentation

## 2014-12-23 DIAGNOSIS — Z792 Long term (current) use of antibiotics: Secondary | ICD-10-CM | POA: Insufficient documentation

## 2014-12-23 DIAGNOSIS — N12 Tubulo-interstitial nephritis, not specified as acute or chronic: Secondary | ICD-10-CM | POA: Insufficient documentation

## 2014-12-23 DIAGNOSIS — Z720 Tobacco use: Secondary | ICD-10-CM | POA: Insufficient documentation

## 2014-12-23 LAB — URINALYSIS, ROUTINE W REFLEX MICROSCOPIC
BILIRUBIN URINE: NEGATIVE
GLUCOSE, UA: NEGATIVE mg/dL
KETONES UR: NEGATIVE mg/dL
Nitrite: POSITIVE — AB
PH: 5.5 (ref 5.0–8.0)
PROTEIN: NEGATIVE mg/dL
Specific Gravity, Urine: 1.017 (ref 1.005–1.030)
Urobilinogen, UA: 0.2 mg/dL (ref 0.0–1.0)

## 2014-12-23 LAB — POC URINE PREG, ED: Preg Test, Ur: NEGATIVE

## 2014-12-23 LAB — BASIC METABOLIC PANEL
Anion gap: 8 (ref 5–15)
BUN: 10 mg/dL (ref 6–20)
CALCIUM: 9 mg/dL (ref 8.9–10.3)
CO2: 26 mmol/L (ref 22–32)
Chloride: 102 mmol/L (ref 101–111)
Creatinine, Ser: 0.84 mg/dL (ref 0.44–1.00)
GFR calc Af Amer: >60 mL/min (ref >60)
GLUCOSE: 83 mg/dL (ref 65–99)
Potassium: 3.8 mmol/L (ref 3.5–5.1)
Sodium: 136 mmol/L (ref 135–145)

## 2014-12-23 LAB — CBC
HCT: 41.5 % (ref 36.0–46.0)
HEMOGLOBIN: 14.1 g/dL (ref 12.0–15.0)
MCH: 29.4 pg (ref 26.0–34.0)
MCHC: 34 g/dL (ref 30.0–36.0)
MCV: 86.6 fL (ref 78.0–100.0)
Platelets: 344 10*3/uL (ref 150–400)
RBC: 4.79 MIL/uL (ref 3.87–5.11)
RDW: 12.9 % (ref 11.5–15.5)
WBC: 7.7 10*3/uL (ref 4.0–10.5)

## 2014-12-23 LAB — URINE MICROSCOPIC-ADD ON

## 2014-12-23 MED ORDER — HYDROCODONE-ACETAMINOPHEN 5-325 MG PO TABS: 1.0000 | ORAL_TABLET | Freq: Four times a day (QID) | ORAL | Status: DC | PRN

## 2014-12-23 MED ORDER — SODIUM CHLORIDE 0.9 % IV BOLUS (SEPSIS)
1000.0000 mL | Freq: Once | INTRAVENOUS | Status: AC
  Administered 2014-12-23: 1000 mL via INTRAVENOUS

## 2014-12-23 MED ORDER — FENTANYL CITRATE (PF) 100 MCG/2ML IJ SOLN
50.0000 ug | Freq: Once | INTRAMUSCULAR | Status: AC
  Administered 2014-12-23: 50 ug via INTRAVENOUS
  Filled 2014-12-23: qty 2

## 2014-12-23 MED ORDER — ONDANSETRON HCL 4 MG/2ML IJ SOLN
4.0000 mg | Freq: Once | INTRAMUSCULAR | Status: AC
  Administered 2014-12-23: 4 mg via INTRAVENOUS
  Filled 2014-12-23: qty 2

## 2014-12-23 MED ORDER — DEXTROSE 5 % IV SOLN
1.0000 g | Freq: Once | INTRAVENOUS | Status: AC
  Administered 2014-12-23: 1 g via INTRAVENOUS
  Filled 2014-12-23: qty 10

## 2014-12-23 MED ORDER — ONDANSETRON 4 MG PO TBDP: ORAL_TABLET | ORAL | Status: DC

## 2014-12-23 MED ORDER — CIPROFLOXACIN HCL 500 MG PO TABS: 500.0000 mg | ORAL_TABLET | Freq: Two times a day (BID) | ORAL | Status: DC

## 2014-12-23 NOTE — ED Provider Notes (Signed)
CSN: [id number]     Arrival date & time 12/23/14  2130 History   First MD Initiated Contact with Patient 12/23/14 2242     Chief Complaint  Patient presents with  . Flank Pain  . Hematuria     (Consider location/radiation/quality/duration/timing/severity/associated sxs/prior Treatment) HPI  24 year old female presents with bilateral flank pain that started today. Has been having dysuria and his been treated for a UTI since having symptoms 1 week ago. Started on Keflex and Pyridium 2 days ago. States he has been taking this as prescribed. Has subjective fevers and chills. Thrown up twice. Patient has some suprapubic abdominal pain. Is having severe back pain.  History reviewed. No pertinent past medical history. Past Surgical History  Procedure Laterality Date  . Right hand surgery     . Right wrist surgery     Family History  Problem Relation Age of Onset  . Hypertension Father   . Cancer Paternal Grandfather     skin cancer    Social History  Substance Use Topics  . Smoking status: Current Every Day Smoker -- 0.00 packs/day for 0 years    Types: Cigarettes  . Smokeless tobacco: Never Used     Comment: about 5 cigarettes a day  . Alcohol Use: No   OB History    Gravida Para Term Preterm AB TAB SAB Ectopic Multiple Living   0              Review of Systems  Constitutional: Positive for fever and chills.  Respiratory: Negative for cough and shortness of breath.   Gastrointestinal: Positive for nausea, vomiting and abdominal pain.  Genitourinary: Positive for dysuria, urgency, frequency, hematuria (today) and flank pain.  All other systems reviewed and are negative.     Allergies  Review of patient's allergies indicates no known allergies.  Home Medications   Prior to Admission medications   Medication Sig Start Date End Date Taking? Authorizing Provider  albuterol (PROVENTIL HFA;VENTOLIN HFA) 108 (90 BASE) MCG/ACT inhaler Inhale 2 puffs into the lungs 4 (four)  times daily. Patient not taking: Reported on 03/27/2014 01/06/14   Reuben Likes, MD  cephALEXin (KEFLEX) 500 MG capsule Take 1 capsule (500 mg total) by mouth 4 (four) times daily. 12/21/14   Elpidio Anis, PA-C  guaiFENesin-codeine (ROBITUSSIN AC) 100-10 MG/5ML syrup Take 5 mLs by mouth 3 (three) times daily as needed for cough. Patient not taking: Reported on 03/27/2014 01/06/14   Reuben Likes, MD  HYDROcodone-acetaminophen (NORCO/VICODIN) 5-325 MG per tablet Take 1 tablet by mouth every 4 (four) hours as needed for moderate pain or severe pain. Patient not taking: Reported on 09/11/2014 08/07/14   Loren Racer, MD  ibuprofen (ADVIL,MOTRIN) 400 MG tablet Take 1 tablet (400 mg total) by mouth every 6 (six) hours as needed. Patient not taking: Reported on 08/07/2014 03/27/14   Mellody Drown, PA-C  metroNIDAZOLE (FLAGYL) 500 MG tablet Take 1 tablet (500 mg total) by mouth 2 (two) times daily. Patient not taking: Reported on 08/07/2014 03/27/14   Mellody Drown, PA-C  naproxen (NAPROSYN) 500 MG tablet Take 1 tablet (500 mg total) by mouth 2 (two) times daily. Patient not taking: Reported on 03/27/2014 11/24/13   Fayrene Helper, PA-C  ondansetron (ZOFRAN ODT) 4 MG disintegrating tablet  ODT q4 hours prn nausea/vomit Patient not taking: Reported on 09/11/2014 08/07/14   Loren Racer, MD  penicillin v potassium (VEETID) 500 MG tablet Take 1 tablet (500 mg total) by mouth 3 (three) times daily. Patient  not taking: Reported on 03/27/2014 11/24/13   Fayrene Helper, PA-C  phenazopyridine (PYRIDIUM) 200 MG tablet Take 1 tablet (200 mg total) by mouth 3 (three) times daily. 12/21/14   Elpidio Anis, PA-C  predniSONE (DELTASONE) 20 MG tablet Take 3 daily for 5 days, 2 daily for 5 days, 1 daily for 5 days. Patient not taking: Reported on 03/27/2014 01/06/14   Reuben Likes, MD  traMADol (ULTRAM) 50 MG tablet Take 1 tablet (50 mg total) by mouth every 6 (six) hours as needed. Patient not taking: Reported on 08/07/2014 04/07/14    Antony Madura, PA-C  traMADol (ULTRAM) 50 MG tablet Take 1 tablet (50 mg total) by mouth every 6 (six) hours as needed. Patient not taking: Reported on 12/21/2014 09/11/14   Francee Piccolo, PA-C   BP 119/67 mmHg  Pulse 102  Temp(Src) 98.2 F (36.8 C) (Oral)  Resp 16  Ht  (1.499 m)  Wt 94 lb (42.638 kg)  BMI 18.98 kg/m2  SpO2 98%  LMP 11/17/2014 Physical Exam  Constitutional: She is oriented to person, place, and time. She appears well-developed and well-nourished.  HENT:  Head: Normocephalic and atraumatic.  Right Ear: External ear normal.  Left Ear: External ear normal.  Nose: Nose normal.  Eyes: Right eye exhibits no discharge. Left eye exhibits no discharge.  Cardiovascular: Normal rate, regular rhythm and normal heart sounds.   HR ~100  Pulmonary/Chest: Effort normal and breath sounds normal.  Abdominal: Soft. There is tenderness in the suprapubic area. There is CVA tenderness (bilateral).  Neurological: She is alert and oriented to person, place, and time.  Skin: Skin is warm and dry. She is not diaphoretic.  Nursing note and vitals reviewed.   ED Course  Procedures (including critical care time) Labs Review Labs Reviewed  URINALYSIS, ROUTINE W REFLEX MICROSCOPIC (NOT AT [facility]) - Abnormal; Notable for the following:    Color, Urine ORANGE (*)    APPearance CLOUDY (*)    Hgb urine dipstick LARGE (*)    Nitrite POSITIVE (*)    Leukocytes, UA MODERATE (*)    All other components within normal limits  URINE MICROSCOPIC-ADD ON - Abnormal; Notable for the following:    Squamous Epithelial / LPF FEW (*)    Bacteria, UA MANY (*)    All other components within normal limits  URINE CULTURE  BASIC METABOLIC PANEL  CBC  POC URINE PREG, ED    Imaging Review No results found. I have personally reviewed and evaluated these images and lab results as part of my medical decision-making.   EKG Interpretation None      MDM   Final diagnoses:  Pyelonephritis     Patient symptoms are consistent with pyelonephritis. She is hemodynamically stable with only mild tachycardia. Urine is still consistent with urinary tract infection lites no culture was sent last time, will send this time. Was on Keflex which should've treated her symptoms given pansensitive Escherichia coli on the most recent culture a few months ago. Will treat with IV Rocephin, and discharge on Cipro as well as pain and nausea control. No vomiting in the ER. Discussed strict return precautions. She is requesting a urology follow-up as she thinks she has interstitial cystitis.    Pricilla Loveless, MD 12/23/14 [phone]

## 2014-12-23 NOTE — Discharge Instructions (Signed)
Pyelonephritis, Adult °Pyelonephritis is a kidney infection. In general, there are 2 main types of pyelonephritis: °· Infections that come on quickly without any warning (acute pyelonephritis). °· Infections that persist for a long period of time (chronic pyelonephritis). °CAUSES  °Two main causes of pyelonephritis are: °· Bacteria traveling from the bladder to the kidney. This is a problem especially in pregnant women. The urine in the bladder can become filled with bacteria from multiple causes, including: °¨ Inflammation of the prostate gland (prostatitis). °¨ Sexual intercourse in females. °¨ Bladder infection (cystitis). °· Bacteria traveling from the bloodstream to the tissue part of the kidney. °Problems that may increase your risk of getting a kidney infection include: °· Diabetes. °· Kidney stones or bladder stones. °· Cancer. °· Catheters placed in the bladder. °· Other abnormalities of the kidney or ureter. °SYMPTOMS  °· Abdominal pain. °· Pain in the side or flank area. °· Fever. °· Chills. °· Upset stomach. °· Blood in the urine (dark urine). °· Frequent urination. °· Strong or persistent urge to urinate. °· Burning or stinging when urinating. °DIAGNOSIS  °Your caregiver may diagnose your kidney infection based on your symptoms. A urine sample may also be taken. °TREATMENT  °In general, treatment depends on how severe the infection is.  °· If the infection is mild and caught early, your caregiver may treat you with oral antibiotics and send you home. °· If the infection is more severe, the bacteria may have gotten into the bloodstream. This will require intravenous (IV) antibiotics and a hospital stay. Symptoms may include: °¨ High fever. °¨ Severe flank pain. °¨ Shaking chills. °· Even after a hospital stay, your caregiver may require you to be on oral antibiotics for a period of time. °· Other treatments may be required depending upon the cause of the infection. °HOME CARE INSTRUCTIONS  °· Take your  antibiotics as directed. Finish them even if you start to feel better. °· Make an appointment to have your urine checked to make sure the infection is gone. °· Drink enough fluids to keep your urine clear or pale yellow. °· Take medicines for the bladder if you have urgency and frequency of urination as directed by your caregiver. °SEEK IMMEDIATE MEDICAL CARE IF:  °· You have a fever or persistent symptoms for more than 2-3 days. °· You have a fever and your symptoms suddenly get worse. °· You are unable to take your antibiotics or fluids. °· You develop shaking chills. °· You experience extreme weakness or fainting. °· There is no improvement after 2 days of treatment. °MAKE SURE YOU: °· Understand these instructions. °· Will watch your condition. °· Will get help right away if you are not doing well or get worse. °Document Released: 03/12/2005 Document Revised: 09/11/2011 Document Reviewed: 08/16/2010 °[employer]® Patient Information ©2015 [employer]. This information is not intended to replace advice given to you by your health care provider. Make sure you discuss any questions you have with your health care provider. ° °

## 2014-12-23 NOTE — ED Notes (Signed)
Pt. reports bilateral lower flank pain with dysuria , hematuria , emesis and concentrated urine onset last night , denies fever or chills.

## 2014-12-26 LAB — URINE CULTURE: Culture: >=100000

## 2014-12-28 ENCOUNTER — Telehealth (HOSPITAL_COMMUNITY): Payer: Self-pay

## 2014-12-28 NOTE — Telephone Encounter (Signed)
Post ED Visit - Positive Culture Follow-up  Culture report reviewed by antimicrobial stewardship pharmacist:   Celedonio Miyamoto, Pharm.D., BCPS  Georgina Pillion, Pharm.D., BCPS  [place], [address].D., BCPS, AAHIVP  Estella Husk, Pharm.D., BCPS, AAHIVP  [place], [address].D.  Cassie Belhaven, [place].D.  Positive Urine culture, >/= 100,000 colonies -> E Coli Treated with Ciprofloxacin, organism sensitive to the same and no further patient follow-up is required at this time.  Arvid Right 12/28/2014, 4:47 AM

## 2015-05-24 ENCOUNTER — Emergency Department (HOSPITAL_COMMUNITY)
Admission: EM | Admit: 2015-05-24 | Discharge: 2015-05-24 | Disposition: A | Discharge status: Home / Self Care | Payer: Self-pay | Attending: Emergency Medicine | Admitting: Emergency Medicine

## 2015-05-24 ENCOUNTER — Emergency Department (HOSPITAL_COMMUNITY): Payer: Self-pay

## 2015-05-24 ENCOUNTER — Encounter (HOSPITAL_COMMUNITY): Payer: Self-pay | Admitting: Neurology

## 2015-05-24 DIAGNOSIS — Z79899 Other long term (current) drug therapy: Secondary | ICD-10-CM | POA: Insufficient documentation

## 2015-05-24 DIAGNOSIS — K5900 Constipation, unspecified: Secondary | ICD-10-CM | POA: Insufficient documentation

## 2015-05-24 DIAGNOSIS — Z3202 Encounter for pregnancy test, result negative: Secondary | ICD-10-CM | POA: Insufficient documentation

## 2015-05-24 DIAGNOSIS — F17210 Nicotine dependence, cigarettes, uncomplicated: Secondary | ICD-10-CM | POA: Insufficient documentation

## 2015-05-24 LAB — I-STAT BETA HCG BLOOD, ED (MC, WL, AP ONLY)

## 2015-05-24 NOTE — Discharge Instructions (Signed)
Judy Pham,  Nice meeting you! Please follow-up with your primary care provider (info attached if you do not have one). Return to the emergency department if you do not produce a stool within 2 days despite increased fluid intake (at least 64 ounces of water per day), magnesium citrate, and enemas at home. Feel better soon!  S. Lane Hacker, PA-C Constipation, Adult Constipation is when a person has fewer than three bowel movements a week, has difficulty having a bowel movement, or has stools that are dry, hard, or larger than normal. As people grow older, constipation is more common. A low-fiber diet, not taking in enough fluids, and taking certain medicines may make constipation worse.  CAUSES   Certain medicines, such as antidepressants, pain medicine, iron supplements, antacids, and water pills.   Certain diseases, such as diabetes, irritable bowel syndrome (IBS), thyroid disease, or depression.   Not drinking enough water.   Not eating enough fiber-rich foods.   Stress or travel.   Lack of physical activity or exercise.   Ignoring the urge to have a bowel movement.   Using laxatives too much.  SIGNS AND SYMPTOMS   Having fewer than three bowel movements a week.   Straining to have a bowel movement.   Having stools that are hard, dry, or larger than normal.   Feeling full or bloated.   Pain in the lower abdomen.   Not feeling relief after having a bowel movement.  DIAGNOSIS  Your health care provider will take a medical history and perform a physical exam. Further testing may be done for severe constipation. Some tests may include:  A barium enema X-ray to examine your rectum, colon, and, sometimes, your small intestine.   A sigmoidoscopy to examine your lower colon.   A colonoscopy to examine your entire colon. TREATMENT  Treatment will depend on the severity of your constipation and what is causing it. Some dietary treatments include drinking  more fluids and eating more fiber-rich foods. Lifestyle treatments may include regular exercise. If these diet and lifestyle recommendations do not help, your health care provider may recommend taking over-the-counter laxative medicines to help you have bowel movements. Prescription medicines may be prescribed if over-the-counter medicines do not work.  HOME CARE INSTRUCTIONS   Eat foods that have a lot of fiber, such as fruits, vegetables, whole grains, and beans.  Limit foods high in fat and processed sugars, such as french fries, hamburgers, cookies, candies, and soda.   A fiber supplement may be added to your diet if you cannot get enough fiber from foods.   Drink enough fluids to keep your urine clear or pale yellow.   Exercise regularly or as directed by your health care provider.   Go to the restroom when you have the urge to go. Do not hold it.   Only take over-the-counter or prescription medicines as directed by your health care provider. Do not take other medicines for constipation without talking to your health care provider first.  SEEK IMMEDIATE MEDICAL CARE IF:   You have bright red blood in your stool.   Your constipation lasts for more than 4 days or gets worse.   You have abdominal or rectal pain.   You have thin, pencil-like stools.   You have unexplained weight loss. MAKE SURE YOU:   Understand these instructions.  Will watch your condition.  Will get help right away if you are not doing well or get worse.   This information is not intended  to replace advice given to you by your health care provider. Make sure you discuss any questions you have with your health care provider.   Document Released: 12/09/2003 Document Revised: 04/02/2014 Document Reviewed: 12/22/2012 Elsevier Interactive Patient Education 2016 [employer].   Emergency Department Resource Guide 1) Find a Doctor and Pay Out of Pocket Although you won't have to find out who is  covered by your insurance plan, it is a good idea to ask around and get recommendations. You will then need to call the office and see if the doctor you have chosen will accept you as a new patient and what types of options they offer for patients who are self-pay. Some doctors offer discounts or will set up payment plans for their patients who do not have insurance, but you will need to ask so you aren't surprised when you get to your appointment.  2) Contact Your Local Health Department Not all health departments have doctors that can see patients for sick visits, but many do, so it is worth a call to see if yours does. If you don't know where your local health department is, you can check in your phone book. The CDC also has a tool to help you locate your state's health department, and many state websites also have listings of all of their local health departments.  3) Find a Walk-in Clinic If your illness is not likely to be very severe or complicated, you may want to try a walk in clinic. These are popping up all over the country in pharmacies, drugstores, and shopping centers. They're usually staffed by nurse practitioners or physician assistants that have been trained to treat common illnesses and complaints. They're usually fairly quick and inexpensive. However, if you have serious medical issues or chronic medical problems, these are probably not your best option.  No Primary Care Doctor: - Call Health Connect at  [phone] - they can help you locate a primary care doctor that  accepts your insurance, provides certain services, etc. - Physician Referral Service- [phone]  Chronic Pain Problems: Organization         Address  Phone   Notes  [facility]  [phone] Patients need to be referred by their primary care doctor.   Medication Assistance: Organization         Address  Phone   Notes  [facility] Medication [facility] [address] [place] [place], [place] [address] [phone] --Must be a resident of [facility] -- Must have NO insurance coverage whatsoever (no Medicaid/ Medicare, etc.) -- The pt. MUST have a primary care doctor that directs their care regularly and follows them in the community   [facility]  [phone]   [employer]  [phone]    Agencies that provide inexpensive medical care: Organization         Address  Phone   Notes  [facility]  [phone]   [facility]    [phone]   [facility] [address] [place], [place] [address] [phone]   [facility] [address] [place] [place] [phone]   Planned Parenthood    [phone]   [facility]    [phone]   [facility]  [address], [place] Phone:  [phone], Fax:  [phone] Hours of Operation:  9 am - 6 pm, M-F.  Also  accepts Medicaid/Medicare and self-pay.  [facility]  [address], [place] Phone: [phone], Fax: [phone]. Hours of Operation:  8:30 am - 5:30 pm, M-F.  Also accepts Medicaid and self-pay.  [facility] [place] [address], [place] Phone: [phone]   [facility] [address] Natasha Bence [place], [place] [phone], Ext. [phone] Mondays & Thursdays: 7-9 AM.  First 15 patients are seen on a first come, first serve basis.    Medicaid-accepting [facility] Providers:  Organization         Address  Phone   Notes  [facility] [address], [place] [phone] Also accepts self-pay patients.  [facility] [address] Laurell Josephs [place], [place]  [phone]   [facility] [address], [place] [phone]   [facility] Family Medicine [address], [place] [phone]   Renaye Rakers [address], [place]   [phone] Only accepts [place] Access [place] patients after they have their name applied to their card.   Self-Pay (no insurance) in [facility]:  Organization         Address  Phone   Notes  Sickle Cell Patients, [facility] Internal Medicine [address] [place], [place] [phone]   [facility] [address] [place], [place] [phone]   [facility] [place]  [address], [place] [phone]   [facility]/Dr. Osei-Bonsu  [address] [place] [address], [place] or [address], [place] [phone] Phone number for both [place] and [place] locations is the same.  [facility] [address] [place] [phone]   [facility] [address] [place] or [address] [phone]   [facility] [address] [place] [phone], phone; [phone], fax Sees patients 1st and 3rd Saturday of every month.  Must not qualify for public or private insurance (i.e. Medicaid, Medicare, [place] Health Choice, Veterans' Benefits)  Household income should be no more than 200% of the poverty level The clinic cannot treat you if you are pregnant or think you are pregnant  Sexually transmitted diseases are not treated at the clinic.    Dental Care: Organization         Address  Phone  Notes  [facility] [address] [place], [place] [phone] Accepts children up to age 22 who are enrolled in [place] or [place] Health Choice; pregnant women with a Medicaid card; and children who have applied for Medicaid or [place] Health Choice, but were declined, whose parents can pay a reduced fee at time of service.  [facility] Department of [facility]  [address], [place] [phone] Accepts children up to age 52 who are enrolled  in [place] or [place] Health Choice; pregnant women with a Medicaid card; and children who have applied for Medicaid or [place] Health Choice, but were declined, whose parents can pay a reduced fee at time of service.  [facility]  [address] [place], [place] [phone] Patients are seen by appointment only. Walk-ins are not accepted. [facility] will see patients 61 years of age and older. Monday - Tuesday (8am-5pm)  Most Wednesdays (8:30-5pm) $30 per visit, cash only  [facility] Adult [employer] PROGRAM  [address], [facility] [phone] Patients are seen by appointment only. Walk-ins are not accepted. [facility] will see patients 60 years of age and older. One Wednesday Evening (Monthly: Volunteer Based).  $30 per visit, cash only  [employer]  [phone] for adults; Children under age 76, call Graduate Pediatric Dentistry at [phone]. Children aged 43-14, please call [phone] to request a pediatric application.  Dental services are provided in all areas of dental care including fillings, crowns and bridges, complete and partial dentures, implants, gum treatment, root canals, and extractions. Preventive care is also provided. Treatment is provided to both adults and children. Patients are selected via a lottery and there is often a waiting list.   [facility] [address] [place]  [phone] [email or web address]   [facility] [address] [place], [place] [phone], Ext. [phone] Second and Fourth Thursday of each month, opens at 6:30 AM; Clinic ends at 9 AM.  Patients are seen on a first-come first-served basis, and a limited number are seen during each clinic.   [facility]  [address] Ether Griffins [place], [place] [phone]   Eligibility Requirements You must have lived in [place], [place], or [place] counties for at least the last three months.   You cannot be  eligible for state or federal sponsored [place], including [employer], [place], or [employer].   You generally cannot be eligible for healthcare insurance through your employer.    How to apply: Eligibility screenings are held every Tuesday and Wednesday afternoon from 1:00 pm until 4:00 pm. You do not need an appointment for the interview!  [facility] [address], [place], [place] [phone]   [facility] Health Department  [phone]   [facility] Health Department  [phone]   [facility] Health Department  [phone]    Behavioral Health Resources in the Community: Intensive Outpatient Programs Organization         Address  Phone  Notes  [facility] [address], [place], [place] [phone]   [facility] Outpatient [address], [place], [place] [phone]   [facility] [address] [place], [place]  [phone]   [facility] [address],  [place], [place] [phone] or [phone]   Substance Abuse Resources Organization         Address  Phone  Notes  [facility]  [phone]   [facility]  [phone]   [facility] [place]  [phone]   Floydene Flock  [phone]   Residential & Outpatient Substance Abuse Program  [phone]   Psychological Services Organization         Address  Phone  Notes  [facility]  [phone]   [facility]  [phone]   [facility] Mental Health [address] [place] [phone] or [phone]    Mobile Crisis Teams Organization         Address  Phone  Notes  [facility], Mobile Crisis Care Unit  [phone]   [facility]  [address] [place], [place] [phone]   Doristine Locks [address] [place] [phone]    Self-Help/Support Groups Organization          Address  Phone  Notes  [facility] [place] - variety of support groups  [phone]- I7437963 Call for more information  Narcotics Anonymous (NA), [facility] [address], [employer] [place]  2 meetings at this location   Statistician         Address  Phone  Notes  [facility] [address],    [place]  [phone]   [facility]  [address], [place] [address], [place], [place] [phone]   [facility] [address] [place], [place] [phone] Admissions: 8am-3pm M-F  [facility] [address],    [place], [place] [phone]   [facility] [address] [place], [place], [place] [phone]   [facility] [address]  [place], [place] [phone]   [facility] - Intensive Outpatient [address]urell [address], [place], [place] [phone]   [facility] [address] [place]  [place], [place] [phone] or [phone]   [facility] ([facility]) [address] [place], [place] [phone] Accepts Medicaid  [facility] [address],  [place] [phone] Substance Abuse/Addiction Treatment   [facility] Organization         Address  Phone  Notes  [facility]  [phone]   Angie Fava, PhD [address] Ervin Knack [employer], [place]   [phone] or [phone]   [facility]   [address] [place], [place] [phone]   [facility] [address] [place], [place] [phone] Insurance/Medicaid/sponsorship through [facility] and Families [address]                                    [place], [place] [phone] Therapy/tele-psych/case  [facility] [address], [place] [phone]    Dr. Lolly Mustache  [phone]   [facility]  [employer]  [facility] Dept. 1) [address],  2) [address] [place] 3)  [address], [place] [phone]  [phone]   [facility] Child Abuse Hotline [phone]) [phone] or [phone] (After Hours)

## 2015-05-24 NOTE — ED Notes (Signed)
Pt reports constipation, last BM was 2 weeks. Takes subaxone and has hx of same. Has been trying to use laxatives without relief, but has gas.

## 2015-05-24 NOTE — ED Provider Notes (Signed)
CSN: [id number]     Arrival date & time 05/24/15  1042 History   First MD Initiated Contact with Patient 05/24/15 1250     Chief Complaint  Patient presents with  . Constipation   HPI   Judy Pham is a 25 y.o. female with no significant PMH presenting with a 2 week history of constipation. She states she has had a history of this previously, and takes suboxone. She denies adequate fluid intake and exercise. She has tried laxatives at home without relief. She endorses passing gas. She denies fevers, chills, chest pain, shortness of breath, abdominal pain, nausea, vomiting, multiple bowel surgeries, hematochezia.  History reviewed. No pertinent past medical history. Past Surgical History  Procedure Laterality Date  . Right hand surgery     . Right wrist surgery     Family History  Problem Relation Age of Onset  . Hypertension Father   . Cancer Paternal Grandfather     skin cancer    Social History  Substance Use Topics  . Smoking status: Current Every Day Smoker -- 0.00 packs/day for 0 years    Types: Cigarettes  . Smokeless tobacco: Never Used     Comment: about 5 cigarettes a day  . Alcohol Use: No   OB History    Gravida Para Term Preterm AB TAB SAB Ectopic Multiple Living   0              Review of Systems  Ten systems are reviewed and are negative for acute change except as noted in the HPI  Allergies  Review of patient's allergies indicates no known allergies.  Home Medications   Prior to Admission medications   Medication Sig Start Date End Date Taking? Authorizing Provider  albuterol (PROVENTIL HFA;VENTOLIN HFA) 108 (90 BASE) MCG/ACT inhaler Inhale 2 puffs into the lungs 4 (four) times daily. Patient not taking: Reported on 03/27/2014 01/06/14   Reuben Likes, MD  ciprofloxacin (CIPRO) 500 MG tablet Take 1 tablet (500 mg total) by mouth 2 (two) times daily. One po bid x 7 days 12/23/14   Pricilla Loveless, MD  HYDROcodone-acetaminophen (NORCO) 5-325 MG tablet Take 1  tablet by mouth every 6 (six) hours as needed for severe pain. 12/23/14   Pricilla Loveless, MD  ondansetron (ZOFRAN ODT) 4 MG disintegrating tablet  ODT q4 hours prn nausea/vomit 12/23/14   Pricilla Loveless, MD  phenazopyridine (PYRIDIUM) 200 MG tablet Take 1 tablet (200 mg total) by mouth 3 (three) times daily. 12/21/14   Elpidio Anis, PA-C   BP 94/61 mmHg  Pulse 69  Temp(Src) 98.3 F (36.8 C) (Oral)  Resp 16  SpO2 98%  LMP 05/08/2015 Physical Exam  Constitutional: She appears well-developed and well-nourished. No distress.  HENT:  Head: Normocephalic and atraumatic.  Mouth/Throat: Oropharynx is clear and moist. No oropharyngeal exudate.  Eyes: Conjunctivae are normal. Right eye exhibits no discharge. Left eye exhibits no discharge. No scleral icterus.  Neck: No tracheal deviation present.  Cardiovascular: Normal rate, regular rhythm, normal heart sounds and intact distal pulses.  Exam reveals no gallop and no friction rub.   No murmur heard. Pulmonary/Chest: Effort normal and breath sounds normal. No respiratory distress. She has no wheezes. She has no rales. She exhibits no tenderness.  Abdominal: Soft. Bowel sounds are normal. She exhibits no distension and no mass. There is no tenderness. There is no rebound and no guarding.  Musculoskeletal: She exhibits no edema.  Lymphadenopathy:    She has no cervical adenopathy.  Neurological: She is alert. Coordination normal.  Skin: Skin is warm and dry. No rash noted. She is not diaphoretic. No erythema.  Psychiatric: She has a normal mood and affect. Her behavior is normal.  Nursing note and vitals reviewed.   ED Course  Procedures  Labs Review Labs Reviewed  I-STAT BETA HCG BLOOD, ED ([facility], [facility], [facility] ONLY)   Imaging Review Dg Abd 1 View  05/24/2015  CLINICAL DATA:  Abdominal pain for 5 months. Constipation for 2 weeks. Initial encounter. EXAM: ABDOMEN - 1 VIEW COMPARISON:  CT abdomen and pelvis 08/07/2014. FINDINGS: The bowel gas pattern  is nonobstructive. Moderately large volume of stool is present throughout the colon with a large stool ball in the rectosigmoid. IMPRESSION: Prominent stool burden throughout with a large stool ball in the rectosigmoid. No acute abnormality. Electronically Signed   By: Drusilla Kanner M.D.   On: 05/24/2015 14:02   I have personally reviewed and evaluated these images and lab results as part of my medical decision-making.  MDM   Final diagnoses:  Constipation, unspecified constipation type   Patient nontoxic appearing, vital signs stable. Patient refuses rectal exam or disimpaction. Abdominal x-ray demonstrates prominent stool burden throughout with a large stool ball in the rectosigmoid but no acute abnormality. Patient may be safely discharged home. Discussed use of magnesium citrate, laxatives, increased fluid intake, exercise. Patient to return within 2 days if she has not produced a bowel movement. Patient follow-up with primary care provider within one week. She is in understanding and agreement with the plan.  Melton Krebs, PA-C 05/24/15 1419  Rolland Porter, MD 06/06/15 [phone]

## 2015-09-01 ENCOUNTER — Ambulatory Visit (HOSPITAL_COMMUNITY)
Admission: EM | Admit: 2015-09-01 | Discharge: 2015-09-01 | Disposition: A | Discharge status: Home / Self Care | Payer: Self-pay | Attending: Physician Assistant | Admitting: Physician Assistant

## 2015-09-01 ENCOUNTER — Encounter (HOSPITAL_COMMUNITY): Payer: Self-pay | Admitting: Emergency Medicine

## 2015-09-01 DIAGNOSIS — N390 Urinary tract infection, site not specified: Secondary | ICD-10-CM

## 2015-09-01 LAB — POCT URINALYSIS DIP (DEVICE)
GLUCOSE, UA: NEGATIVE mg/dL
Ketones, ur: 15 mg/dL — AB
NITRITE: POSITIVE — AB
PH: 8.5 — AB (ref 5.0–8.0)
Specific Gravity, Urine: 1.02 (ref 1.005–1.030)

## 2015-09-01 MED ORDER — AMOXICILLIN 500 MG PO CAPS: 500.0000 mg | ORAL_CAPSULE | Freq: Three times a day (TID) | ORAL | Status: DC

## 2015-09-01 NOTE — ED Provider Notes (Signed)
CSN: [id number]     Arrival date & time 09/01/15  1637 History   None    Chief Complaint  Patient presents with  . Urinary Tract Infection  . Bronchitis   (Consider location/radiation/quality/duration/timing/severity/associated sxs/prior Treatment) Patient is a 25 y.o. female presenting with urinary tract infection. The history is provided by the patient. No language interpreter was used.  Urinary Tract Infection Pain quality:  Aching Pain severity:  Mild Onset quality:  Gradual Duration:  2 weeks Timing:  Constant Progression:  Worsening Chronicity:  New Recent urinary tract infections: yes   Relieved by:  Nothing Worsened by:  Nothing tried Ineffective treatments:  Acetaminophen Urinary symptoms: no discolored urine   Associated symptoms: no abdominal pain   Risk factors: not pregnant   Pt also complains of cough and congestion.  No fever, no shortness of breath  History reviewed. No pertinent past medical history. Past Surgical History  Procedure Laterality Date  . Right hand surgery     . Right wrist surgery     Family History  Problem Relation Age of Onset  . Hypertension Father   . Cancer Paternal Grandfather     skin cancer    Social History  Substance Use Topics  . Smoking status: Current Every Day Smoker -- 0.00 packs/day for 0 years    Types: Cigarettes  . Smokeless tobacco: Never Used     Comment: about 5 cigarettes a day  . Alcohol Use: No   OB History    Gravida Para Term Preterm AB TAB SAB Ectopic Multiple Living   0              Review of Systems  Gastrointestinal: Negative for abdominal pain.  All other systems reviewed and are negative.   Allergies  Review of patient's allergies indicates no known allergies.  Home Medications   Prior to Admission medications   Medication Sig Start Date End Date Taking? Authorizing Provider  albuterol (PROVENTIL HFA;VENTOLIN HFA) 108 (90 BASE) MCG/ACT inhaler Inhale 2 puffs into the lungs 4 (four) times  daily. Patient not taking: Reported on 03/27/2014 01/06/14   Reuben Likes, MD  aspirin-acetaminophen-caffeine [facility] MIGRAINE) [phone] MG tablet Take 1 tablet by mouth every 6 (six) hours as needed for headache.    Historical Provider, MD  buprenorphine-naloxone (SUBOXONE) 8-2 MG SUBL SL tablet Place 1 tablet under the tongue daily.    Historical Provider, MD  ciprofloxacin (CIPRO) 500 MG tablet Take 1 tablet (500 mg total) by mouth 2 (two) times daily. One po bid x 7 days Patient not taking: Reported on 05/24/2015 12/23/14   Pricilla Loveless, MD  HYDROcodone-acetaminophen (NORCO) 5-325 MG tablet Take 1 tablet by mouth every 6 (six) hours as needed for severe pain. Patient not taking: Reported on 05/24/2015 12/23/14   Pricilla Loveless, MD  ondansetron (ZOFRAN ODT) 4 MG disintegrating tablet  ODT q4 hours prn nausea/vomit Patient not taking: Reported on 05/24/2015 12/23/14   Pricilla Loveless, MD  phenazopyridine (PYRIDIUM) 200 MG tablet Take 1 tablet (200 mg total) by mouth 3 (three) times daily. Patient not taking: Reported on 05/24/2015 12/21/14   Elpidio Anis, PA-C   Meds Ordered and Administered this Visit  Medications - No data to display  BP 107/72 mmHg  Pulse 73  Temp(Src) 98.3 F (36.8 C) (Oral)  SpO2 100% No data found.   Physical Exam  Constitutional: She appears well-developed and well-nourished.  HENT:  Head: Normocephalic.  Right Ear: External ear normal.  Left Ear: External ear  normal.  Eyes: Conjunctivae are normal. Pupils are equal, round, and reactive to light.  Neck: Normal range of motion. Neck supple.  Cardiovascular: Normal rate and regular rhythm.   Pulmonary/Chest: Effort normal.  Abdominal: Soft. Bowel sounds are normal.  Musculoskeletal: Normal range of motion.  Skin: Skin is warm.  Nursing note and vitals reviewed.   ED Course  Procedures (including critical care time)  Labs Review Labs Reviewed  POCT URINALYSIS DIP (DEVICE) - Abnormal; Notable for  the following:    Bilirubin Urine MODERATE (*)    Ketones, ur 15 (*)    Hgb urine dipstick LARGE (*)    pH 8.5 (*)    Protein, ur >=300 (*)    Nitrite POSITIVE (*)    Leukocytes, UA LARGE (*)    All other components within normal limits    Imaging Review No results found.   Visual Acuity Review  Right Eye Distance:   Left Eye Distance:   Bilateral Distance:    Right Eye Near:   Left Eye Near:    Bilateral Near:         MDM   1. UTI (lower urinary tract infection)    Meds ordered this encounter  Medications  . amoxicillin (AMOXIL) 500 MG capsule    Sig: Take 1 capsule (500 mg total) by mouth 3 (three) times daily.    Dispense:  30 capsule    Refill:  0    Order Specific Question:  Supervising Provider    Answer:  Linna HoffKINDL, JAMES D [id number]]    An After Visit Summary was printed and given to the patient.  Lonia SkinnerLeslie K ChanningSofia, PA-C 09/01/15 1850

## 2015-09-01 NOTE — ED Notes (Signed)
Patient not placed in treatment room

## 2015-09-01 NOTE — ED Notes (Signed)
Patient reports symptoms of uti: bilateral flank pain, left greater than right, decreased urinary output.  Symptoms for 2 weeks. Patient also has concerns for heavy chest sensation, cough, phlegm, chest hurts to cough

## 2015-09-01 NOTE — Discharge Instructions (Signed)
Upper Respiratory Infection, Adult °Most upper respiratory infections (URIs) are a viral infection of the air passages leading to the lungs. A URI affects the nose, throat, and upper air passages. The most common type of URI is nasopharyngitis and is typically referred to as "the common cold." °URIs run their course and usually go away on their own. Most of the time, a URI does not require medical attention, but sometimes a bacterial infection in the upper airways can follow a viral infection. This is called a secondary infection. Sinus and middle ear infections are common types of secondary upper respiratory infections. °Bacterial pneumonia can also complicate a URI. A URI can worsen asthma and chronic obstructive pulmonary disease (COPD). Sometimes, these complications can require emergency medical care and may be life threatening.  °CAUSES °Almost all URIs are caused by viruses. A virus is a type of germ and can spread from one person to another.  °RISKS FACTORS °You may be at risk for a URI if:  °· You smoke.   °· You have chronic heart or lung disease. °· You have a weakened defense (immune) system.   °· You are very young or very old.   °· You have nasal allergies or asthma. °· You work in crowded or poorly ventilated areas. °· You work in health care facilities or schools. °SIGNS AND SYMPTOMS  °Symptoms typically develop 2-3 days after you come in contact with a cold virus. Most viral URIs last 7-10 days. However, viral URIs from the influenza virus (flu virus) can last 14-18 days and are typically more severe. Symptoms may include:  °· Runny or stuffy (congested) nose.   °· Sneezing.   °· Cough.   °· Sore throat.   °· Headache.   °· Fatigue.   °· Fever.   °· Loss of appetite.   °· Pain in your forehead, behind your eyes, and over your cheekbones (sinus pain). °· Muscle aches.   °DIAGNOSIS  °Your health care provider may diagnose a URI by: °· Physical exam. °· Tests to check that your symptoms are not due to  another condition such as: °· Strep throat. °· Sinusitis. °· Pneumonia. °· Asthma. °TREATMENT  °A URI goes away on its own with time. It cannot be cured with medicines, but medicines may be prescribed or recommended to relieve symptoms. Medicines may help: °· Reduce your fever. °· Reduce your cough. °· Relieve nasal congestion. °HOME CARE INSTRUCTIONS  °· Take medicines only as directed by your health care provider.   °· Gargle warm saltwater or take cough drops to comfort your throat as directed by your health care provider. °· Use a warm mist humidifier or inhale steam from a shower to increase air moisture. This may make it easier to breathe. °· Drink enough fluid to keep your urine clear or pale yellow.   °· Eat soups and other clear broths and maintain good nutrition.   °· Rest as needed.   °· Return to work when your temperature has returned to normal or as your health care provider advises. You may need to stay home longer to avoid infecting others. You can also use a face mask and careful hand washing to prevent spread of the virus. °· Increase the usage of your inhaler if you have asthma.   °· Do not use any tobacco products, including cigarettes, chewing tobacco, or electronic cigarettes. If you need help quitting, ask your health care provider. °PREVENTION  °The best way to protect yourself from getting a cold is to practice good hygiene.  °· Avoid oral or hand contact with people with cold   symptoms.   °· Wash your hands often if contact occurs.   °There is no clear evidence that vitamin C, vitamin E, echinacea, or exercise reduces the chance of developing a cold. However, it is always recommended to get plenty of rest, exercise, and practice good nutrition.  °SEEK MEDICAL CARE IF:  °· You are getting worse rather than better.   °· Your symptoms are not controlled by medicine.   °· You have chills. °· You have worsening shortness of breath. °· You have brown or red mucus. °· You have yellow or brown nasal  discharge. °· You have pain in your face, especially when you bend forward. °· You have a fever. °· You have swollen neck glands. °· You have pain while swallowing. °· You have white areas in the back of your throat. °SEEK IMMEDIATE MEDICAL CARE IF:  °· You have severe or persistent: °¨ Headache. °¨ Ear pain. °¨ Sinus pain. °¨ Chest pain. °· You have chronic lung disease and any of the following: °¨ Wheezing. °¨ Prolonged cough. °¨ Coughing up blood. °¨ A change in your usual mucus. °· You have a stiff neck. °· You have changes in your: °¨ Vision. °¨ Hearing. °¨ Thinking. °¨ Mood. °MAKE SURE YOU:  °· Understand these instructions. °· Will watch your condition. °· Will get help right away if you are not doing well or get worse. °  °This information is not intended to replace advice given to you by your health care provider. Make sure you discuss any questions you have with your health care provider. °  °Document Released: 09/05/2000 Document Revised: 07/27/2014 Document Reviewed: 06/17/2013 °Elsevier Interactive Patient Education ©2016 [employer] ° °Urinary Tract Infection °Urinary tract infections (UTIs) can develop anywhere along your urinary tract. Your urinary tract is your body's drainage system for removing wastes and extra water. Your urinary tract includes two kidneys, two ureters, a bladder, and a urethra. Your kidneys are a pair of bean-shaped organs. Each kidney is about the size of your fist. They are located below your ribs, one on each side of your spine. °CAUSES °Infections are caused by microbes, which are microscopic organisms, including fungi, viruses, and bacteria. These organisms are so small that they can only be seen through a microscope. Bacteria are the microbes that most commonly cause UTIs. °SYMPTOMS  °Symptoms of UTIs may vary by age and gender of the patient and by the location of the infection. Symptoms in young women typically include a frequent and intense urge to urinate and a  painful, burning feeling in the bladder or urethra during urination. Older women and men are more likely to be tired, shaky, and weak and have muscle aches and abdominal pain. A fever may mean the infection is in your kidneys. Other symptoms of a kidney infection include pain in your back or sides below the ribs, nausea, and vomiting. °DIAGNOSIS °To diagnose a UTI, your caregiver will ask you about your symptoms. Your caregiver will also ask you to provide a urine sample. The urine sample will be tested for bacteria and white blood cells. White blood cells are made by your body to help fight infection. °TREATMENT  °Typically, UTIs can be treated with medication. Because most UTIs are caused by a bacterial infection, they usually can be treated with the use of antibiotics. The choice of antibiotic and length of treatment depend on your symptoms and the type of bacteria causing your infection. °HOME CARE INSTRUCTIONS °· If you were prescribed antibiotics, take them exactly as your   caregiver instructs you. Finish the medication even if you feel better after you have only taken some of the medication. °· Drink enough water and fluids to keep your urine clear or pale yellow. °· Avoid caffeine, tea, and carbonated beverages. They tend to irritate your bladder. °· Empty your bladder often. Avoid holding urine for long periods of time. °· Empty your bladder before and after sexual intercourse. °· After a bowel movement, women should cleanse from front to back. Use each tissue only once. °SEEK MEDICAL CARE IF:  °· You have back pain. °· You develop a fever. °· Your symptoms do not begin to resolve within 3 days. °SEEK IMMEDIATE MEDICAL CARE IF:  °· You have severe back pain or lower abdominal pain. °· You develop chills. °· You have nausea or vomiting. °· You have continued burning or discomfort with urination. °MAKE SURE YOU:  °· Understand these instructions. °· Will watch your condition. °· Will get help right away if you  are not doing well or get worse. °  °This information is not intended to replace advice given to you by your health care provider. Make sure you discuss any questions you have with your health care provider. °  °Document Released: 12/20/2004 Document Revised: 12/01/2014 Document Reviewed: 04/20/2011 °Elsevier Interactive Patient Education ©2016 Elsevier Inc. ° °

## 2015-11-07 ENCOUNTER — Ambulatory Visit: Payer: Self-pay | Admitting: Family Medicine

## 2016-06-07 ENCOUNTER — Emergency Department (HOSPITAL_COMMUNITY): Payer: Self-pay

## 2016-06-07 ENCOUNTER — Emergency Department (HOSPITAL_COMMUNITY)
Admission: EM | Admit: 2016-06-07 | Discharge: 2016-06-07 | Disposition: A | Discharge status: Home / Self Care | Payer: Self-pay | Attending: Emergency Medicine | Admitting: Emergency Medicine

## 2016-06-07 ENCOUNTER — Encounter (HOSPITAL_COMMUNITY): Payer: Self-pay | Admitting: Emergency Medicine

## 2016-06-07 DIAGNOSIS — W208XXA Other cause of strike by thrown, projected or falling object, initial encounter: Secondary | ICD-10-CM | POA: Insufficient documentation

## 2016-06-07 DIAGNOSIS — S0003XA Contusion of scalp, initial encounter: Secondary | ICD-10-CM

## 2016-06-07 DIAGNOSIS — Y92009 Unspecified place in unspecified non-institutional (private) residence as the place of occurrence of the external cause: Secondary | ICD-10-CM | POA: Insufficient documentation

## 2016-06-07 DIAGNOSIS — Z7982 Long term (current) use of aspirin: Secondary | ICD-10-CM | POA: Insufficient documentation

## 2016-06-07 DIAGNOSIS — Y939 Activity, unspecified: Secondary | ICD-10-CM | POA: Insufficient documentation

## 2016-06-07 DIAGNOSIS — Y999 Unspecified external cause status: Secondary | ICD-10-CM | POA: Insufficient documentation

## 2016-06-07 DIAGNOSIS — F17210 Nicotine dependence, cigarettes, uncomplicated: Secondary | ICD-10-CM | POA: Insufficient documentation

## 2016-06-07 DIAGNOSIS — S0990XA Unspecified injury of head, initial encounter: Secondary | ICD-10-CM

## 2016-06-07 DIAGNOSIS — S161XXA Strain of muscle, fascia and tendon at neck level, initial encounter: Secondary | ICD-10-CM

## 2016-06-07 MED ORDER — OXYCODONE-ACETAMINOPHEN 5-325 MG PO TABS
1.0000 | ORAL_TABLET | Freq: Once | ORAL | Status: AC
  Administered 2016-06-07: 1 via ORAL
  Filled 2016-06-07: qty 1

## 2016-06-07 MED ORDER — METHOCARBAMOL 500 MG PO TABS: 500.0000 mg | ORAL_TABLET | Freq: Three times a day (TID) | ORAL | 0 refills | Status: AC | PRN

## 2016-06-07 MED ORDER — ACETAMINOPHEN 325 MG PO TABS
650.0000 mg | ORAL_TABLET | Freq: Once | ORAL | Status: AC
  Administered 2016-06-07: 650 mg via ORAL

## 2016-06-07 MED ORDER — NAPROXEN 500 MG PO TABS: 500.0000 mg | ORAL_TABLET | Freq: Two times a day (BID) | ORAL | 0 refills | Status: AC | PRN

## 2016-06-07 MED ORDER — BUTALBITAL-APAP-CAFFEINE 50-325-40 MG PO TABS: 1.0000 | ORAL_TABLET | Freq: Three times a day (TID) | ORAL | 0 refills | Status: AC | PRN

## 2016-06-07 MED ORDER — ACETAMINOPHEN 325 MG PO TABS
ORAL_TABLET | ORAL | Status: AC
  Filled 2016-06-07: qty 2

## 2016-06-07 NOTE — ED Provider Notes (Signed)
[facility]-EMERGENCY DEPT Provider Note   CSN: [id number] Arrival date & time: 06/07/16  0203    History   Chief Complaint Chief Complaint  Patient presents with  . Fall  . Head Injury    HPI Judy Pham is a 26 y.o. female.  26 year old female presents the emergency department for evaluation of head injury. Patient states that she was sitting on her bed when a 20 pound mirror placed on top of the mini fridge next to her bed fell on her head. Incident occurred at 0100. No LOC. No nausea or vomiting. Patient felt mildly dizzy after the incident with a headache. This has improved with tylenol given in triage. Patient also with c/o posterior and right sided neck pain. Worse with movement. C-Collar applied in triage. Patient denies vision changes or vision loss, hearing changes or tinnitus, extremity numbness, and unilateral weakness.    The history is provided by the patient. No language interpreter was used.    History reviewed. No pertinent past medical history.  Patient Active Problem List   Diagnosis Date Noted  . Breast lump in female 04/17/2013  . Smoking 04/17/2013  . Joint pain 04/17/2013    Past Surgical History:  Procedure Laterality Date  . right hand surgery     . right wrist surgery      OB History    Gravida Para Term Preterm AB Living   0             SAB TAB Ectopic Multiple Live Births                   Home Medications    Prior to Admission medications   Medication Sig Start Date End Date Taking? Authorizing Provider  aspirin-acetaminophen-caffeine (EXCEDRIN MIGRAINE) [phone] [id number] MG tablet Take 1 tablet by mouth every 6 (six) hours as needed for headache.   Yes Historical Provider, MD  ibuprofen (ADVIL,MOTRIN) 200 MG tablet Take 400-600 mg by mouth every 6 (six) hours as needed for moderate pain.   Yes Historical Provider, MD  butalbital-acetaminophen-caffeine (FIORICET, ESGIC) [phone] MG tablet Take 1-2 tablets by mouth every 8 (eight) hours as needed  for headache. 06/07/16 06/07/17  Antony MaduraKelly Zavior Thomason, PA-C  methocarbamol (ROBAXIN) 500 MG tablet Take 1 tablet (500 mg total) by mouth every 8 (eight) hours as needed for muscle spasms. 06/07/16   Antony MaduraKelly Reubin Bushnell, PA-C  naproxen (NAPROSYN) 500 MG tablet Take 1 tablet (500 mg total) by mouth 2 (two) times daily as needed for mild pain or moderate pain. 06/07/16   Antony MaduraKelly Demetris Meinhardt, PA-C    Family History Family History  Problem Relation Age of Onset  . Hypertension Father   . Cancer Paternal Grandfather     skin cancer     Social History Social History  Substance Use Topics  . Smoking status: Current Every Day Smoker    Packs/day: 0.00    Years: 0.00    Types: Cigarettes  . Smokeless tobacco: Never Used     Comment: about 5 cigarettes a day  . Alcohol use No     Allergies   Patient has no known allergies.   Review of Systems Review of Systems Ten systems reviewed and are negative for acute change, except as noted in the HPI.    Physical Exam Updated Vital Signs BP 118/71 (BP Location: Left Arm)   Pulse 105   Temp 98.1 F (36.7 C) (Oral)   Resp 20   Ht 4\' 10"  (1.473 m)   Wt 40.4 kg  LMP 06/03/2016 (Within Days)   SpO2 100%   BMI 18.60 kg/m   Physical Exam  Constitutional: She is oriented to person, place, and time. She appears well-developed and well-nourished. No distress.  Nontoxic and in NAD  HENT:  Head: Normocephalic and atraumatic.  Mouth/Throat: Oropharynx is clear and moist.  Small contusion to left posterior parietal scalp without overlying hematoma or abrasion. No skull instability. No battle's sign or raccoon's eyes. No hemotympanum bilaterally. Symmetric rise of the uvula with phonation.  Eyes: Conjunctivae and EOM are normal. Pupils are equal, round, and reactive to light. No scleral icterus.  Neck:  C-collar in place. Lower midline and right paraspinal tenderness appreciated on palpation with collar in place. No bony deformities, step offs, or crepitus.    Cardiovascular: Normal rate, regular rhythm and intact distal pulses.   Patient not tachycardic as noted in triage  Pulmonary/Chest: Effort normal. No respiratory distress. She has no wheezes.  Respirations even and unlabored  Musculoskeletal: Normal range of motion.  Neurological: She is alert and oriented to person, place, and time. No cranial nerve deficit. She exhibits normal muscle tone. Coordination normal.  GCS 15. Speech is goal oriented. No cranial nerve deficits appreciated; symmetric eyebrow raise, no facial drooping, tongue midline. Patient has equal grip strength bilaterally with 5/5 strength against resistance in all major muscle groups bilaterally. Sensation to light touch intact. Patient moves extremities without ataxia. Patient ambulatory with steady gait.  Skin: Skin is warm and dry. No rash noted. She is not diaphoretic. No erythema. No pallor.  Psychiatric: She has a normal mood and affect. Her behavior is normal.  Nursing note and vitals reviewed.    ED Treatments / Results  Labs (all labs ordered are listed, but only abnormal results are displayed) Labs Reviewed - No data to display  EKG  EKG Interpretation None       Radiology Ct Cervical Spine Wo Contrast  Result Date: 06/07/2016 CLINICAL DATA:  20 pound mirror fell on top of head while patient was sitting on the floor. Neck pain. EXAM: CT CERVICAL SPINE WITHOUT CONTRAST TECHNIQUE: Multidetector CT imaging of the cervical spine was performed without intravenous contrast. Multiplanar CT image reconstructions were also generated. COMPARISON:  None. FINDINGS: ALIGNMENT: Maintained lordosis. Vertebral bodies in alignment. SKULL BASE AND VERTEBRAE: Cervical vertebral bodies and posterior elements are intact. Intervertebral disc heights preserved. No destructive bony lesions. C1-2 articulation maintained. SOFT TISSUES AND SPINAL CANAL: Nonacute. Subcentimeter cyst LEFT thyroid lobe, no indicated follow-up. DISC LEVELS:  No significant osseous canal stenosis or neural foraminal narrowing. UPPER CHEST: Lung apices are clear. OTHER: None. IMPRESSION: Normal CT cervical spine. Electronically Signed   By: Awilda Metro M.D.   On: 06/07/2016 05:40    Procedures Procedures (including critical care time)  Medications Ordered in ED Medications  acetaminophen (TYLENOL) tablet 650 mg (650 mg Oral Given 06/07/16 0222)  oxyCODONE-acetaminophen (PERCOCET/ROXICET) 5-325 MG per tablet 1 tablet (1 tablet Oral Given 06/07/16 0547)     Initial Impression / Assessment and Plan / ED Course  I have reviewed the triage vital signs and the nursing notes.  Pertinent labs & imaging results that were available during my care of the patient were reviewed by me and considered in my medical decision making (see chart for details).     26 year old female presents to the emergency department for evaluation of neck pain after a [place]urocel on her head. She had no loss of consciousness. No nausea, vomiting, vision changes, or extremity weakness. She  has a nonfocal neurologic exam. No skull instability, battle sign, or raccoon's eyes. Low suspicion for emergent intracranial process. Unable to exclude mild concussion, though will continue with symptomatic management of this.  A CT cervical spine was obtained given cervical midline tenderness. This is negative for compression fracture or ligamentous injury. On reassessment, patient is complaining of soreness only. She has good range of motion of her neck. Patient given prescription for naproxen and Robaxin. Return precautions discussed and provided. Patient discharged in stable condition with no unaddressed concerns.   Final Clinical Impressions(s) / ED Diagnoses   Final diagnoses:  Neck strain, initial encounter  Contusion of scalp, initial encounter  Minor head injury, initial encounter    New Prescriptions New Prescriptions   BUTALBITAL-ACETAMINOPHEN-CAFFEINE (FIORICET, ESGIC)  50-325-40 MG TABLET    Take 1-2 tablets by mouth every 8 (eight) hours as needed for headache.   METHOCARBAMOL (ROBAXIN) 500 MG TABLET    Take 1 tablet (500 mg total) by mouth every 8 (eight) hours as needed for muscle spasms.   NAPROXEN (NAPROSYN) 500 MG TABLET    Take 1 tablet (500 mg total) by mouth 2 (two) times daily as needed for mild pain or moderate pain.     Antony Madura, PA-C 06/07/16 1610    Zadie Rhine, MD 06/08/16 1007

## 2016-06-07 NOTE — Discharge Instructions (Signed)
Take naproxen and Robaxin as prescribed for neck pain and muscle spasm. You may take Fioricet as prescribed for headache. Avoid strenuous activity or heavy lifting over the next week. We also advise that you do not drive a motor vehicle for one week. Follow up with a primary care doctor for recheck. You may return to the ED, as needed, for new or worsening symptoms.

## 2016-06-07 NOTE — ED Triage Notes (Signed)
Pt reports leaning on fridge at home and a 20 pound mirror hit the top of her head. She reports that she was sitting on the floor when it occurred. States no LOC at time of injury. Occurred one hour ago. States drowsy and neck pain. CCollar applied in triage.

## 2016-06-10 IMAGING — CT CT ABD-PELV W/ CM
2 of 4 series · 16 of 46 positions shown, 18 images · IV contrast (Omni 300)
Comparison: None.

CLINICAL DATA: Acute onset of generalized abdominal pain and back
pain. Nausea. Initial encounter.

EXAM:
CT ABDOMEN AND PELVIS WITH CONTRAST
TECHNIQUE: Multidetector CT imaging of the abdomen and pelvis was performed
using the standard protocol following bolus administration of
intravenous contrast.
CONTRAST:  100mL OMNIPAQUE IOHEXOL 300 MG/ML  SOLN

[Series 2: abd/ pelvis 5.0 i30f 1 · axial · 0.63mm/px · z∈[-556,-186]mm · 13 of 82 slices shown, 15 images]
[im 4/82  soft-tissue]
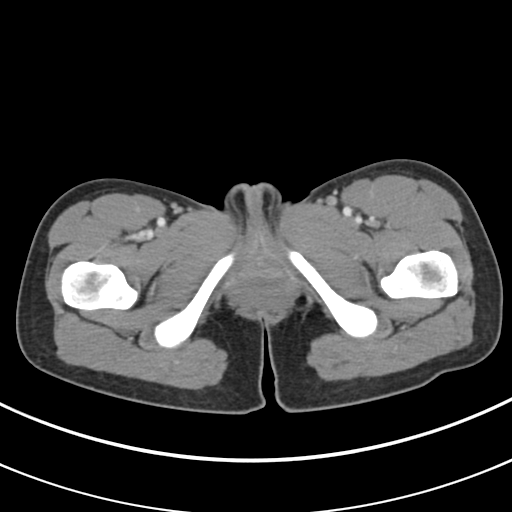
[im 4/82  bone]
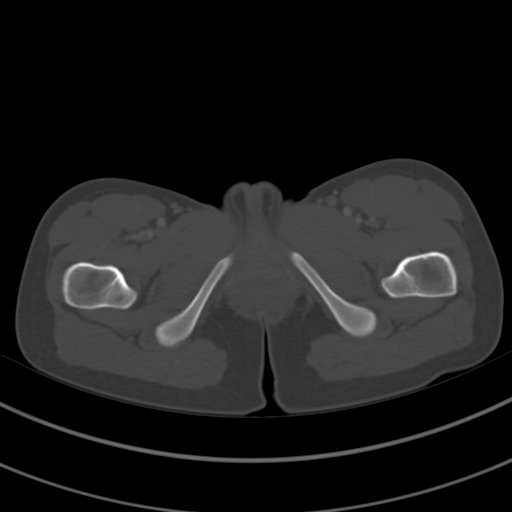
[im 11/82  soft-tissue]
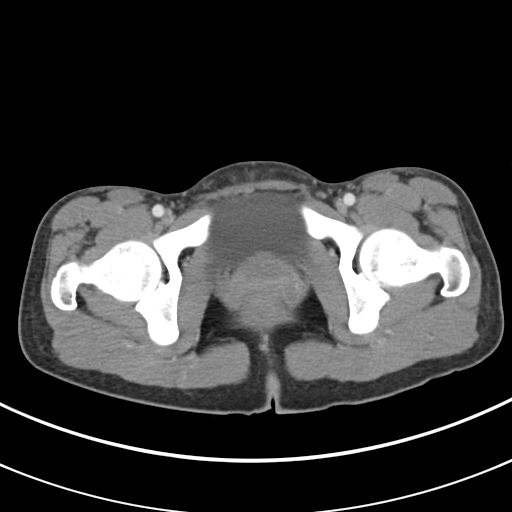
[im 17/82  soft-tissue]
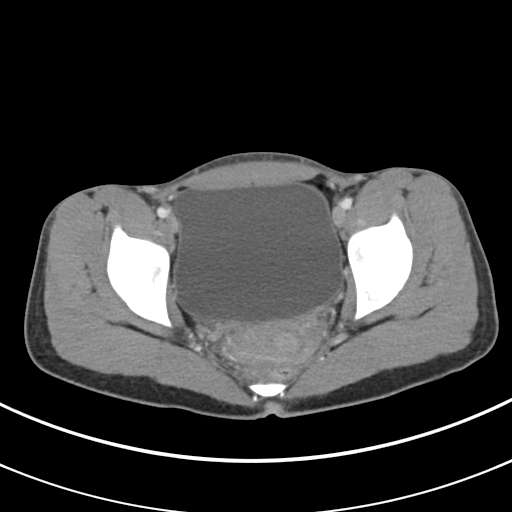
[im 24/82  soft-tissue]
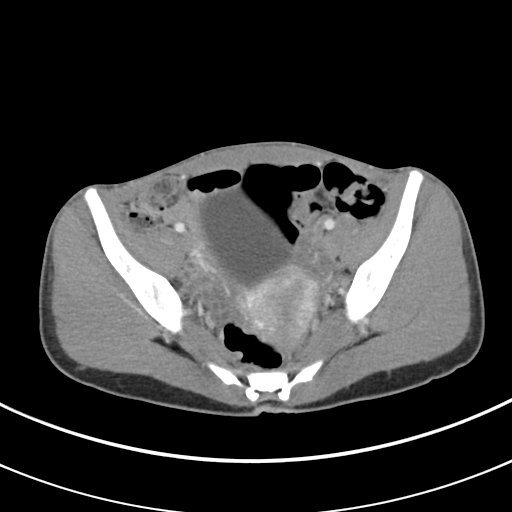
[im 28/82  soft-tissue]
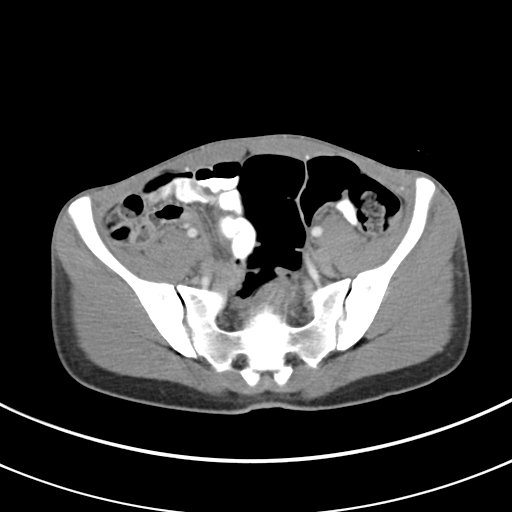
[im 34/82  soft-tissue]
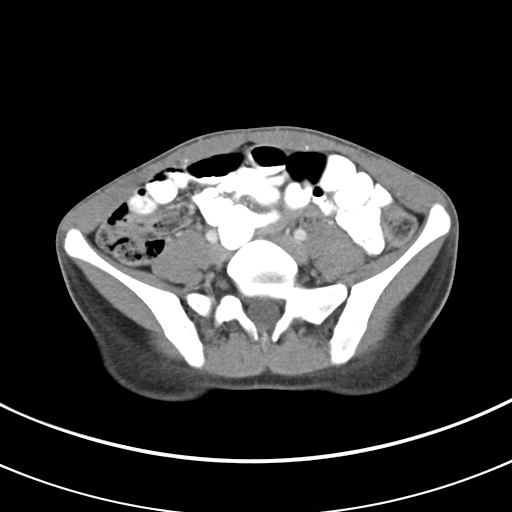
[im 41/82  soft-tissue]
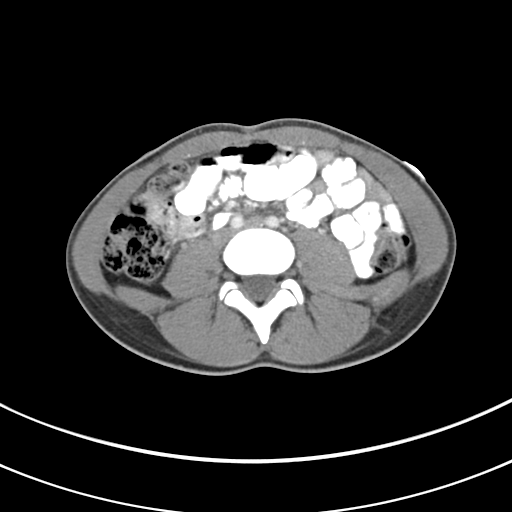
[im 48/82  soft-tissue]
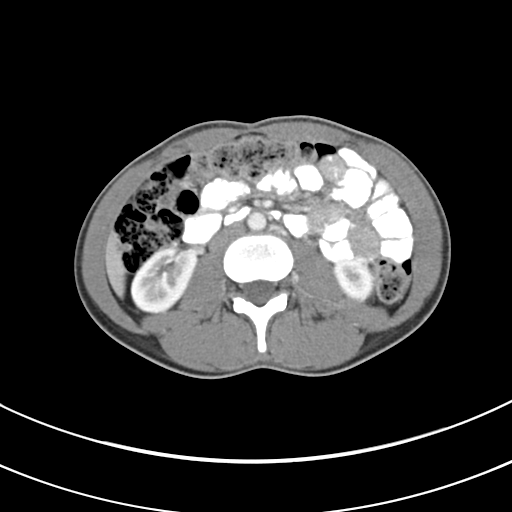
[im 55/82  soft-tissue]
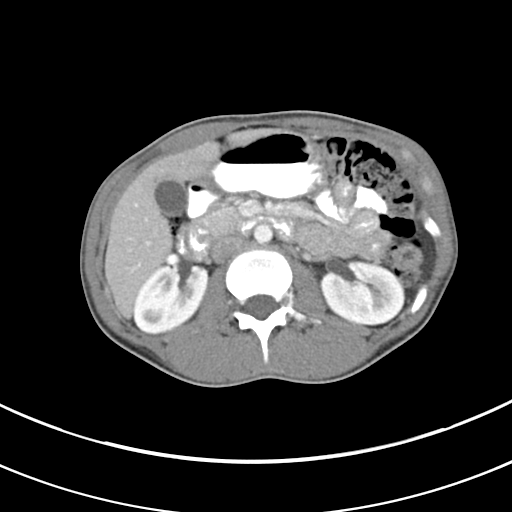
[im 55/82  bone]
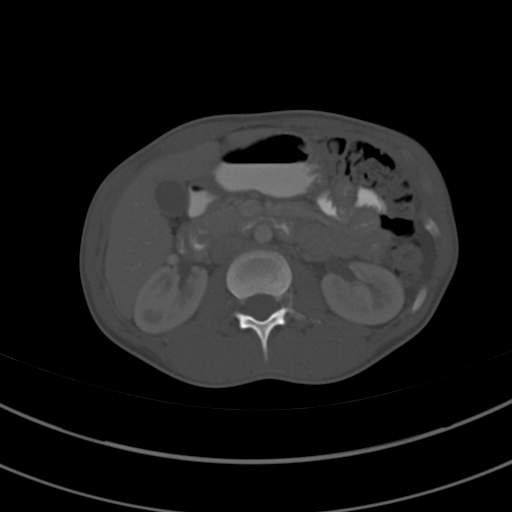
[im 58/82  soft-tissue]
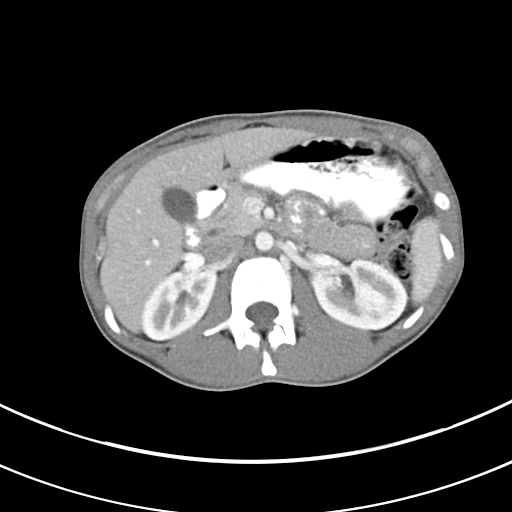
[im 65/82  soft-tissue]
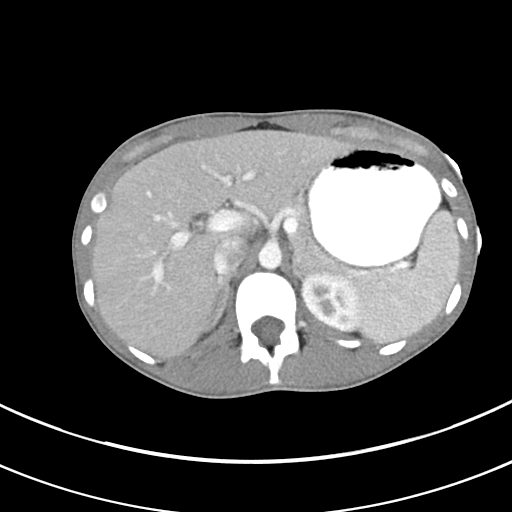
[im 71/82  soft-tissue]
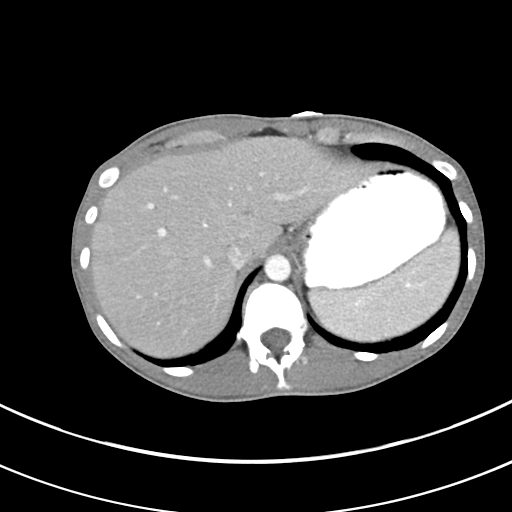
[im 78/82  soft-tissue]
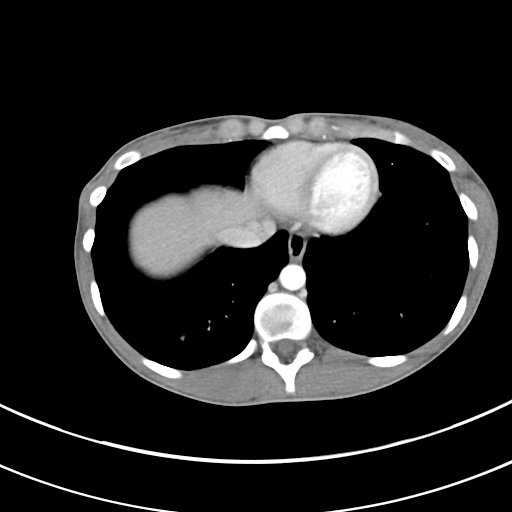

[Series 4: coronals · coronal · 0.58mm/px · 3 of 96 slices shown]
[im 32/96  soft-tissue]
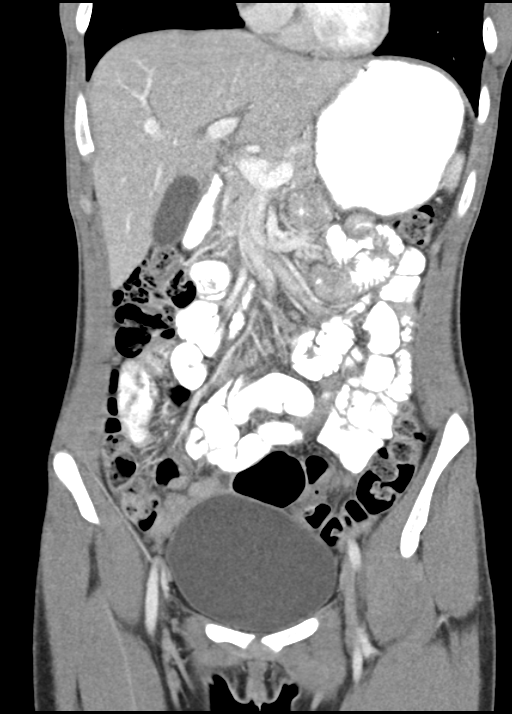
[im 43/96  soft-tissue]
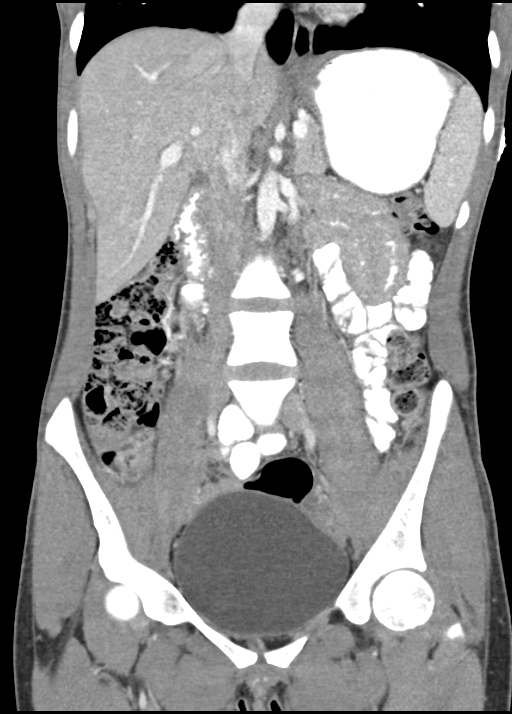
[im 53/96  soft-tissue]
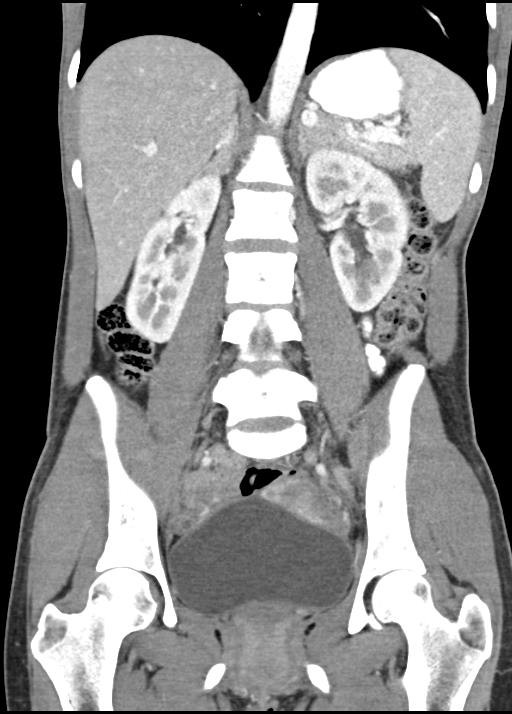

[16 of 46 positions shown; findings below may reference images not displayed]

FINDINGS: The visualized lung bases are clear.

The liver and spleen are unremarkable in appearance. The gallbladder
is within normal limits. The pancreas and adrenal glands are
unremarkable.

The kidneys are unremarkable in appearance. There is no evidence of
hydronephrosis. No renal or ureteral stones are seen. No perinephric
stranding is appreciated.

No free fluid is identified. The small bowel is unremarkable in
appearance. The stomach is within normal limits. No acute vascular
abnormalities are seen.

The appendix is borderline prominent, measuring up to 8 mm, without
definite soft tissue inflammation to suggest appendicitis. The colon
is grossly unremarkable in appearance.

The bladder is moderately distended and grossly unremarkable. The
uterus is within normal limits. The ovaries are relatively
symmetric. No suspicious adnexal masses are seen. No inguinal
lymphadenopathy is seen.

No acute osseous abnormalities are identified.
IMPRESSION: Borderline prominence of the appendix, measuring up to 8 mm, without
definite soft tissue inflammation to suggest appendicitis. This may
reflect the patient's baseline. No acute abnormality seen within the
abdomen or pelvis.

## 2016-06-10 IMAGING — US US PELVIS COMPLETE
1 series · 13 of 25 positions shown · non-contrast
Comparison: None.

CLINICAL DATA: RIGHT lower quadrant pain. Last menstrual period
July 21, 2014.

EXAM:
TRANSABDOMINAL AND TRANSVAGINAL ULTRASOUND OF PELVIS
DOPPLER ULTRASOUND OF OVARIES
TECHNIQUE: Both transabdominal and transvaginal ultrasound examinations of the
pelvis were performed. Transabdominal technique was performed for
global imaging of the pelvis including uterus, ovaries, adnexal
regions, and pelvic cul-de-sac.
It was necessary to proceed with endovaginal exam following the
transabdominal exam to visualize the endometrium and ovaries. Color
and duplex Doppler ultrasound was utilized to evaluate blood flow to
the ovaries.

[Series 1: us pelvis complete · 0.24mm/px · 13 of 60 slices shown]
[im 1/60]
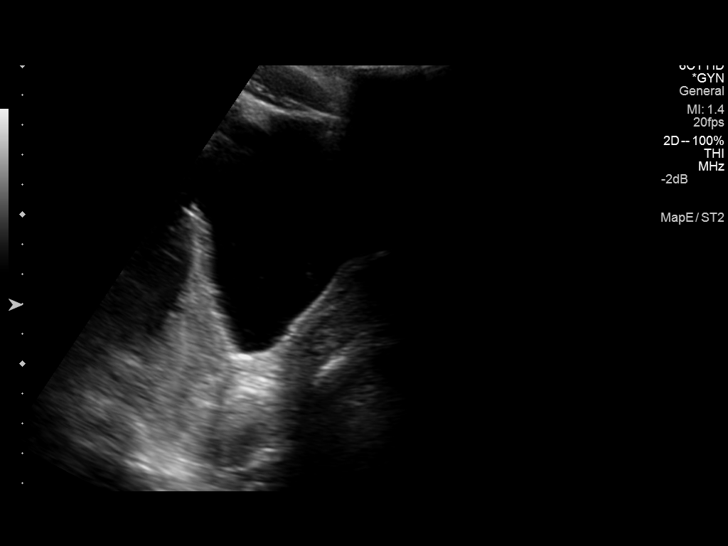
[im 5/60]
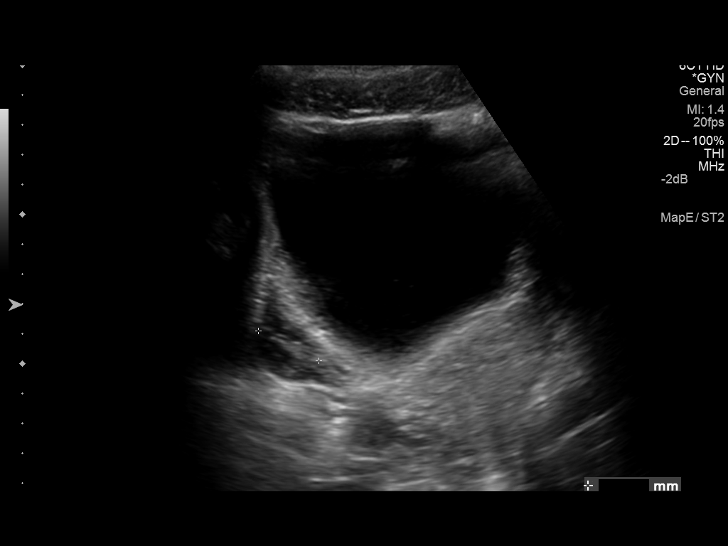
[im 10/60]
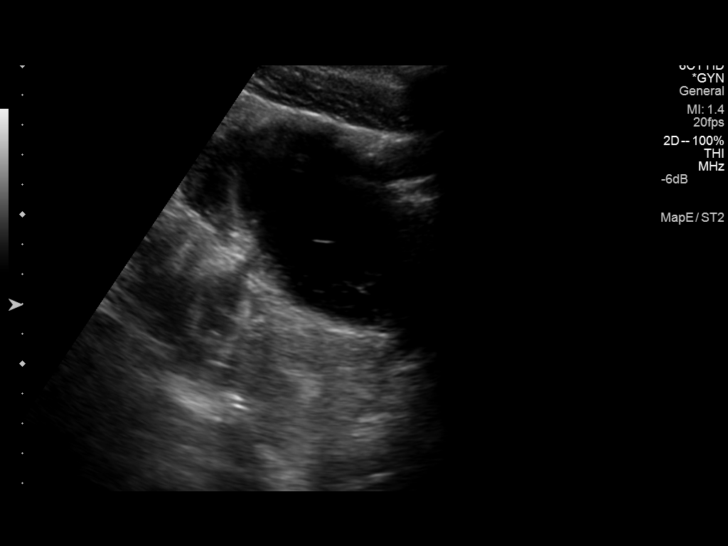
[im 15/60]
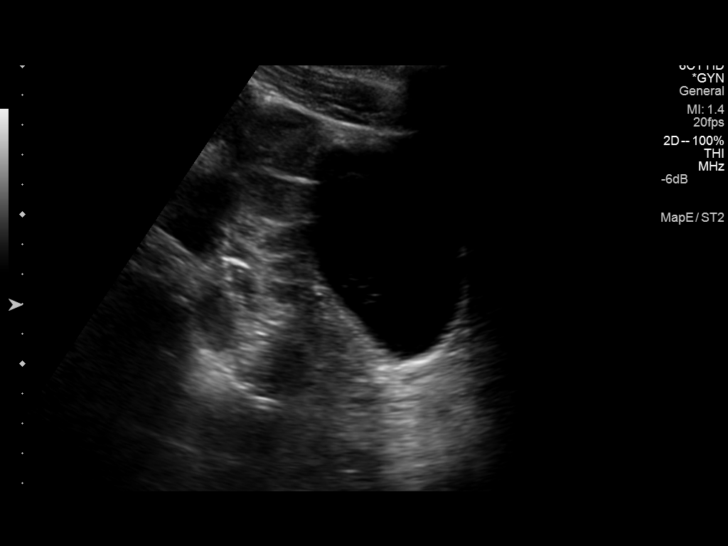
[im 20/60]
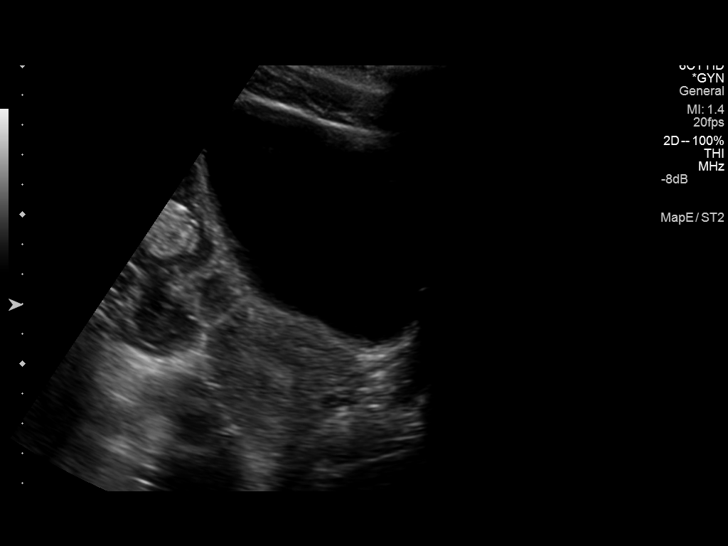
[im 25/60]
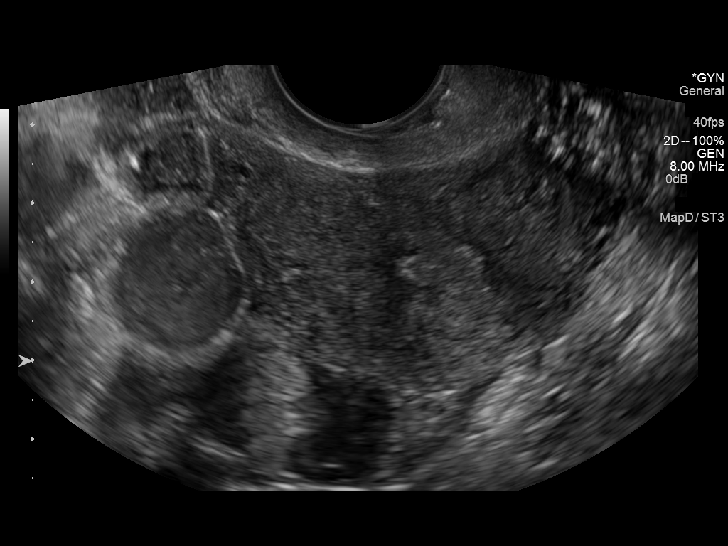
[im 30/60]
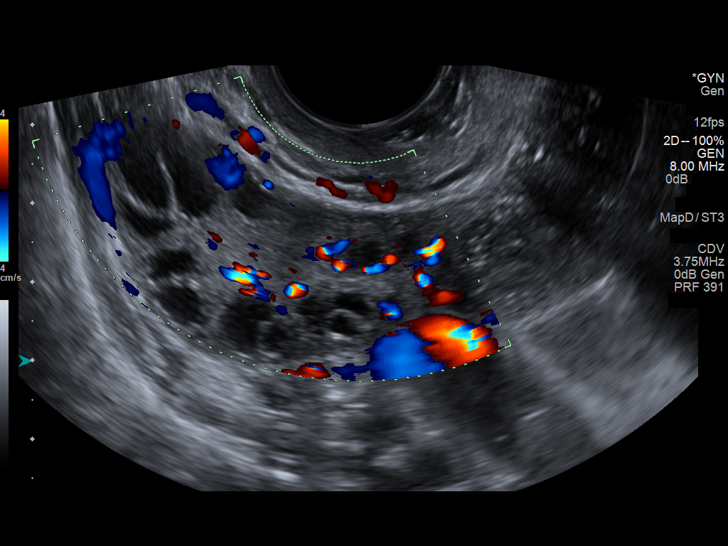
[im 35/60]
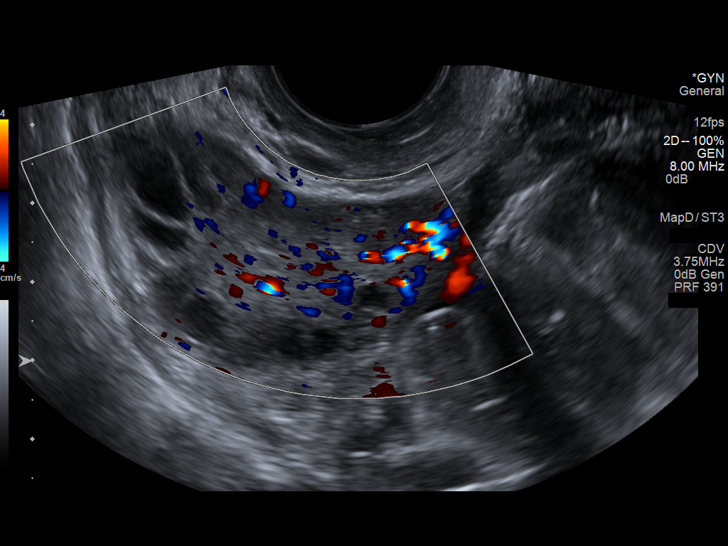
[im 40/60]
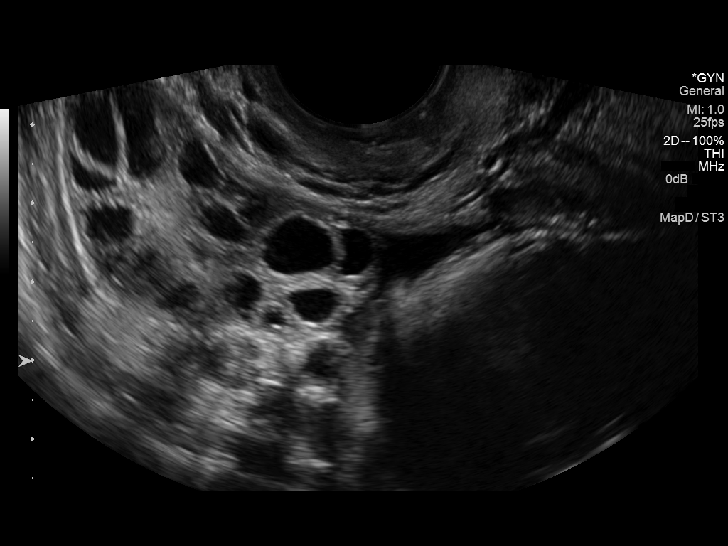
[im 45/60]
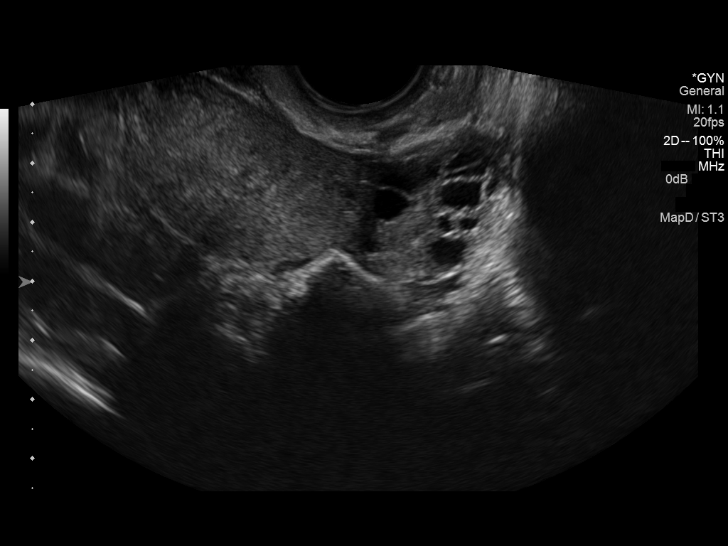
[im 50/60]
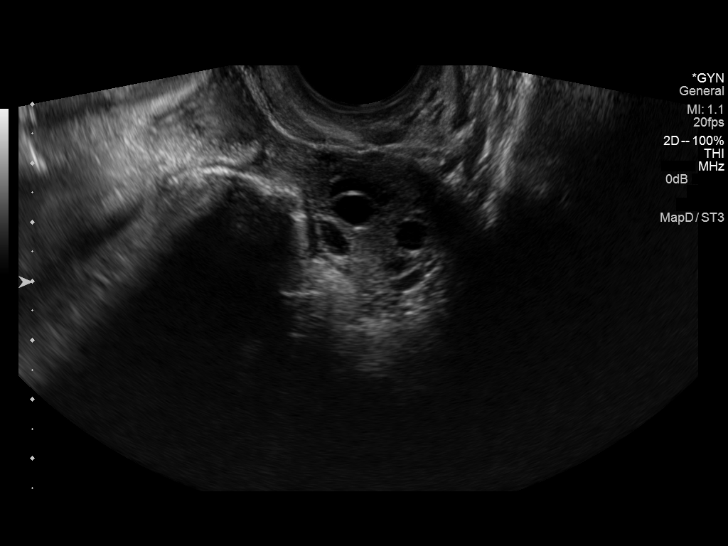
[im 55/60]
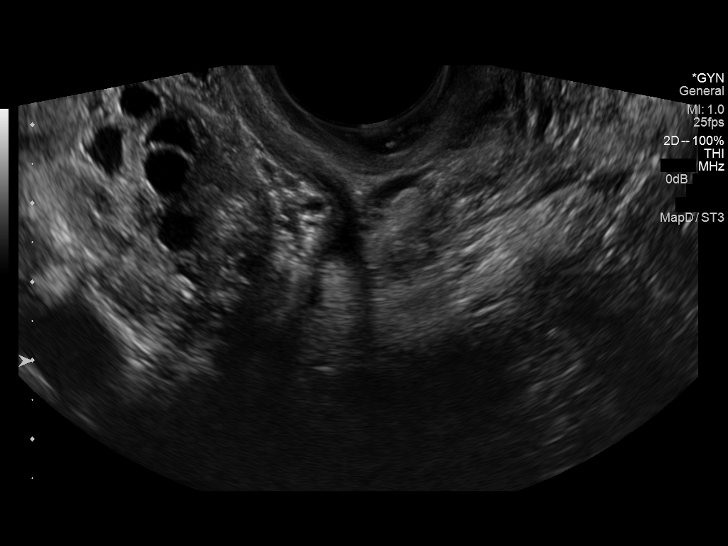
[im 60/60]
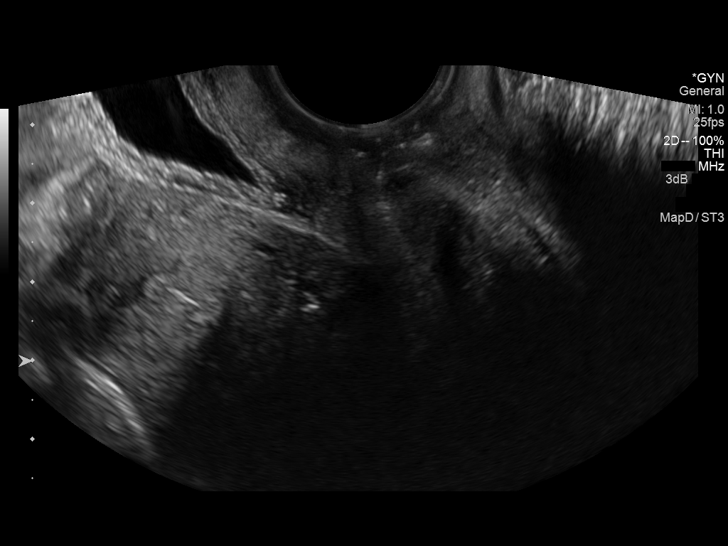

[13 of 25 positions shown; findings below may reference images not displayed]

FINDINGS: Uterus

Measurements: 6.4 x 3 x 4.4 cm. No fibroids or other mass
visualized.

Endometrium

Thickness: 6.1 mm.  No focal abnormality visualized.

Right ovary

Measurements: 4.5 x 2.3 x 4.3 cm. Normal appearance/no adnexal mass.

Left ovary

Measurements: 3.1 x 2.3 x 2.9 cm. Normal appearance/no adnexal mass.

Pulsed Doppler evaluation of both ovaries demonstrates normal
low-resistance arterial and venous waveforms.

Other findings

Echogenic debris in the urinary bladder.
IMPRESSION: Debris in urinary bladder most consistent with cystitis. Otherwise
unremarkable pelvic ultrasound.

By: Calet Fore

## 2017-01-11 ENCOUNTER — Emergency Department (HOSPITAL_COMMUNITY): Payer: Self-pay

## 2017-01-11 ENCOUNTER — Encounter (HOSPITAL_COMMUNITY): Payer: Self-pay | Admitting: Emergency Medicine

## 2017-01-11 ENCOUNTER — Emergency Department (HOSPITAL_COMMUNITY)
Admission: EM | Admit: 2017-01-11 | Discharge: 2017-01-11 | Disposition: A | Discharge status: Home / Self Care | Payer: Self-pay | Attending: Emergency Medicine | Admitting: Emergency Medicine

## 2017-01-11 DIAGNOSIS — R112 Nausea with vomiting, unspecified: Secondary | ICD-10-CM | POA: Insufficient documentation

## 2017-01-11 DIAGNOSIS — Z7982 Long term (current) use of aspirin: Secondary | ICD-10-CM | POA: Insufficient documentation

## 2017-01-11 DIAGNOSIS — F17210 Nicotine dependence, cigarettes, uncomplicated: Secondary | ICD-10-CM | POA: Insufficient documentation

## 2017-01-11 DIAGNOSIS — Z79899 Other long term (current) drug therapy: Secondary | ICD-10-CM | POA: Insufficient documentation

## 2017-01-11 DIAGNOSIS — R1030 Lower abdominal pain, unspecified: Secondary | ICD-10-CM | POA: Insufficient documentation

## 2017-01-11 LAB — I-STAT BETA HCG BLOOD, ED (MC, WL, AP ONLY)

## 2017-01-11 MED ORDER — ONDANSETRON 4 MG PO TBDP
ORAL_TABLET | ORAL | Status: AC
  Administered 2017-01-11: 4 mg
  Filled 2017-01-11: qty 1

## 2017-01-11 MED ORDER — ONDANSETRON 4 MG PO TBDP: 4.0000 mg | ORAL_TABLET | Freq: Once | ORAL | Status: DC | PRN

## 2017-01-11 NOTE — ED Triage Notes (Addendum)
Patient states she is cramping and took a pregnancy test which was positive. She wants to make sure "everything is ok". Only complaints is bilateral lower abdominal pain 5/10 x 3 days and some nausea.

## 2017-01-11 NOTE — Discharge Instructions (Signed)
Please follow up with the health Department on Wednesday for repeat pregnancy test. Your ultrasound showed no evidence of ectopic pregnancy, and your abdominal exam is reassuring. If you develop worsening abdominal pain, fevers or chills, or persistent nausea and vomiting please return to be evaluated sooner.

## 2017-01-11 NOTE — ED Provider Notes (Signed)
[facility]   CSN: [id number] Arrival date & time: 01/11/17  1532     History   Chief Complaint Chief Complaint  Patient presents with  . Abdominal Pain    HPI  Judy LeitzSandi Pham is a 26 y.o. Female with no pertinent past medical history, presents with abdominal cramping for 3 days. She reports she took a pregnancy test at home 3 days ago which was positive. She reports last menstrual period was September 1, and she's been sexually active since. She describes abdominal pain as lower cramping, but lateralizes most often to the left side, she is not taken any medications at home to treat the symptoms. Patient reports some nausea and 2 episodes of vomiting over the past few days, non-bloody. Patient denies any vaginal bleeding, reports  white milky vaginal discharge which is typical for her, no vaginal discomfort, burning with urination, or pelvic pain. Patient denies any diarrhea or constipation, no blood in the stool.       History reviewed. No pertinent past medical history.  Patient Active Problem List   Diagnosis Date Noted  . Breast lump in female 04/17/2013  . Smoking 04/17/2013  . Joint pain 04/17/2013    Past Surgical History:  Procedure Laterality Date  . right hand surgery     . right wrist surgery      OB History    Gravida Para Term Preterm AB Living   1             SAB TAB Ectopic Multiple Live Births                   Home Medications    Prior to Admission medications   Medication Sig Start Date End Date Taking? Authorizing Provider  aspirin-acetaminophen-caffeine (EXCEDRIN MIGRAINE) [phone] MG tablet Take 1 tablet by mouth every 6 (six) hours as needed for headache.    [provider]  butalbital-acetaminophen-caffeine (FIORICET, ESGIC) [id number] MG tablet Take 1-2 tablets by mouth every 8 (eight) hours as needed for headache. 06/07/16 06/07/17  Antony MaduraHumes, Kelly, PA-C  ibuprofen (ADVIL,MOTRIN)  200 MG tablet Take 400-600 mg by mouth every 6 (six) hours as needed for moderate pain.    [provider]  methocarbamol (ROBAXIN) 500 MG tablet Take 1 tablet (500 mg total) by mouth every 8 (eight) hours as needed for muscle spasms. 06/07/16   Antony MaduraHumes, Kelly, PA-C  naproxen (NAPROSYN) 500 MG tablet Take 1 tablet (500 mg total) by mouth 2 (two) times daily as needed for mild pain or moderate pain. 06/07/16   Antony MaduraHumes, Kelly, PA-C    Family History Family History  Problem Relation Age of Onset  . Hypertension Father   . Cancer Paternal Grandfather        skin cancer     Social History Social History  Substance Use Topics  . Smoking status: Current Every Day Smoker    Packs/day: 0.00    Years: 0.00    Types: Cigarettes  . Smokeless tobacco: Never Used     Comment: about 5 cigarettes a day  . Alcohol use No     Allergies   Patient has no known allergies.   Review of Systems Review of Systems  Constitutional: Negative for chills and fever.  Respiratory: Negative for cough, chest tightness and shortness of breath.   Gastrointestinal: Positive for abdominal pain, nausea and vomiting. Negative for blood in stool, constipation and diarrhea.  Genitourinary: Positive for menstrual problem. Negative for dysuria,  pelvic pain, vaginal bleeding and vaginal pain.  Musculoskeletal: Negative for arthralgias and myalgias.  Skin: Negative for rash.  Neurological: Negative for dizziness and light-headedness.     Physical Exam Updated Vital Signs BP 111/77 (BP Location: Left Arm)   Pulse (!) 106   Temp 98.6 F (37 C) (Oral)   Resp 16   Ht 4\' 10"  (1.473 m)   Wt 38.6 kg (85 lb)   LMP 11/24/2016 (Approximate)   SpO2 100%   BMI 17.77 kg/m   Physical Exam  Constitutional: She is oriented to person, place, and time. She appears well-developed and well-nourished. No distress.  HENT:  Head: Normocephalic and atraumatic.  Mouth/Throat: Oropharynx is clear and moist.  Eyes: Right eye  exhibits no discharge. Left eye exhibits no discharge.  Cardiovascular: Normal rate, regular rhythm, normal heart sounds and intact distal pulses.   Pulmonary/Chest: Effort normal and breath sounds normal. No respiratory distress. She has no wheezes. She has no rales.  Abdominal: Soft. Bowel sounds are normal.  Very Mild tenderness in the lower abdomen, left > right, no guarding or rebound tenderness, nontender at McBurney's point, no CVA tenderness, negative Murphy sign.  Musculoskeletal: She exhibits no edema or deformity.  Neurological: She is alert and oriented to person, place, and time. Coordination normal.  Skin: Skin is warm and dry. Capillary refill takes less than 2 seconds. No rash noted. She is not diaphoretic.  Psychiatric: She has a normal mood and affect. Her behavior is normal.  Nursing note and vitals reviewed.    ED Treatments / Results  Labs (all labs ordered are listed, but only abnormal results are displayed) Labs Reviewed  I-STAT BETA HCG BLOOD, ED ([facility], [facility], [facility] ONLY)    EKG  EKG Interpretation None       Radiology US Pelvic Complete With Transvaginal  Result Date: 01/11/2017 CLINICAL DATA:  Lower abdominal pain. Positive home pregnancy test but negative beta HCG today in ED. EXAM: TRANSABDOMINAL AND TRANSVAGINAL ULTRASOUND OF PELVIS TECHNIQUE: Both transabdominal and transvaginal ultrasound examinations of the pelvis were performed. Transabdominal technique was performed for global imaging of the pelvis including uterus, ovaries, adnexal regions, and pelvic cul-de-sac. It was necessary to proceed with endovaginal exam following the transabdominal exam to visualize the endometrium and ovaries. COMPARISON:  None FINDINGS: Uterus Measurements: 7.1 x 3.8 x 4.8 cm. Uterus is anteverted. No fibroids or other mass visualized. Endometrium Thickness: 8.3 mm. No focal abnormality visualized. No intrauterine gestational sac is identified. Right ovary Measurements: 3 x 1.5 x  2.4 cm. Normal appearance/no adnexal mass. Left ovary Measurements: 3.4 x 2.3 x 2.4 cm. Normal appearance/no adnexal mass. Other findings Small amount of free fluid in the pelvis, likely physiologic. Flow is demonstrated in both ovaries on color flow Doppler imaging. IMPRESSION: Normal ultrasound appearance of the uterus and ovaries. No intrauterine gestational sac is identified. No abnormal adnexal masses. Electronically Signed   By: Burman Nieves M.D.   On: 01/11/2017 22:06    Procedures Procedures (including critical care time)  Medications Ordered in ED Medications  ondansetron (ZOFRAN-ODT) disintegrating tablet 4 mg (not administered)  ondansetron (ZOFRAN-ODT) 4 MG disintegrating tablet (4 mg  Given 01/11/17 1633)     Initial Impression / Assessment and Plan / ED Course  I have reviewed the triage vital signs and the nursing notes.  Pertinent labs & imaging results that were available during my care of the patient were reviewed by me and considered in my medical decision making (see chart for details).  Pt presents with mild lower abdominal cramping and nausea with positive home pregnancy test 3 days ago. Intermittent nausea. LMP 11/24/16. Pt well-appearing and vitals normal. HCG < 5 here. Abdominal exam with mild lower abdominal tenderness left > right, pt reports cramping at home more often on left. No vaginal bleeding or discharge. Pt discussed with Dr. Wilkie Aye. Had shared decision making discussion with pt regarding pelvic ultrasound to rule out ectopic pregnancy, discussed that it is less likely with Hcg < 5 but still possible. Pt would like to go ahead with pelvic ultrasound.  Pelvic ultrasound unremarkable, shows no evidence of ectopic pregnancy or adnexal masses, no intrauterine gestational sac visualized. Discussed these results with pt. Explained they positive home pregnancy test could have been the result of a chemical pregnancy. Low suspicion for other intraabdominal pathology  given exam and story. Pt has appointment with health department on Wednesday, recommend repeat pregnancy test at this time. Return precautions provided, Pt expresses understanding and agrees with plan.   Final Clinical Impressions(s) / ED Diagnoses   Final diagnoses:  Lower abdominal pain    New Prescriptions Discharge Medication List as of 01/11/2017 10:25 PM       Dartha Lodge, PA-C 01/12/17 2231    Shon Baton, MD 01/13/17 1404

## 2017-01-11 NOTE — ED Notes (Signed)
Patient transported to Ultrasound 

## 2017-03-12 ENCOUNTER — Encounter (HOSPITAL_COMMUNITY): Payer: Self-pay

## 2020-01-08 ENCOUNTER — Emergency Department (HOSPITAL_COMMUNITY)
Admission: EM | Admit: 2020-01-08 | Discharge: 2020-01-09 | Disposition: A | Discharge status: Home / Self Care | Payer: Self-pay | Attending: Emergency Medicine | Admitting: Emergency Medicine

## 2020-01-08 ENCOUNTER — Other Ambulatory Visit: Payer: Self-pay

## 2020-01-08 ENCOUNTER — Encounter (HOSPITAL_COMMUNITY): Payer: Self-pay | Admitting: Emergency Medicine

## 2020-01-08 DIAGNOSIS — L02414 Cutaneous abscess of left upper limb: Secondary | ICD-10-CM | POA: Insufficient documentation

## 2020-01-08 DIAGNOSIS — Z5321 Procedure and treatment not carried out due to patient leaving prior to being seen by health care provider: Secondary | ICD-10-CM | POA: Insufficient documentation

## 2020-01-08 NOTE — ED Triage Notes (Signed)
Patient reports worsening skin abscess with drainage at left forearm onset last week , denies fever or chills .

## 2020-01-09 ENCOUNTER — Encounter (HOSPITAL_COMMUNITY): Payer: Self-pay | Admitting: *Deleted

## 2020-01-09 ENCOUNTER — Emergency Department (HOSPITAL_COMMUNITY): Admission: EM | Admit: 2020-01-09 | Payer: Self-pay | Source: Home / Self Care

## 2020-01-09 ENCOUNTER — Emergency Department (HOSPITAL_COMMUNITY)
Admission: EM | Admit: 2020-01-09 | Discharge: 2020-01-09 | Disposition: A | Discharge status: Home / Self Care | Payer: Self-pay | Attending: Emergency Medicine | Admitting: Emergency Medicine

## 2020-01-09 DIAGNOSIS — L0291 Cutaneous abscess, unspecified: Secondary | ICD-10-CM

## 2020-01-09 DIAGNOSIS — L02414 Cutaneous abscess of left upper limb: Secondary | ICD-10-CM | POA: Insufficient documentation

## 2020-01-09 DIAGNOSIS — F17210 Nicotine dependence, cigarettes, uncomplicated: Secondary | ICD-10-CM | POA: Insufficient documentation

## 2020-01-09 MED ORDER — HYDROCODONE-ACETAMINOPHEN 5-325 MG PO TABS
2.0000 | ORAL_TABLET | Freq: Once | ORAL | Status: AC
  Administered 2020-01-09: 2 via ORAL
  Filled 2020-01-09: qty 2

## 2020-01-09 MED ORDER — LIDOCAINE HCL (PF) 1 % IJ SOLN
5.0000 mL | Freq: Once | INTRAMUSCULAR | Status: DC
  Filled 2020-01-09: qty 30

## 2020-01-09 MED ORDER — DOXYCYCLINE HYCLATE 100 MG PO TABS: 100.0000 mg | ORAL_TABLET | Freq: Two times a day (BID) | ORAL | 0 refills | Status: AC

## 2020-01-09 NOTE — Discharge Instructions (Signed)
Return if any problems.

## 2020-01-09 NOTE — ED Provider Notes (Signed)
[facility]-EMERGENCY DEPT Provider Note   CSN: [id number] Arrival date & time: 01/09/20  1819     History Chief Complaint  Patient presents with  . Abscess  . Insect Bite    Judy Pham is a 29 y.o. female.  The history is provided by the patient. No language interpreter was used.  Abscess Location:  Shoulder/arm Shoulder/arm abscess location:  L forearm Size:  8 Abscess quality: redness and warmth   Red streaking: no   Progression:  Worsening Chronicity:  New Context: insect bite/sting   Context: not diabetes and not injected drug use   Relieved by:  Nothing Worsened by:  Nothing Ineffective treatments:  None tried Associated symptoms: no nausea    Pt reports she has an abscess on her left arm.  Pt thinks she may have been bitten by something    History reviewed. No pertinent past medical history.  Patient Active Problem List   Diagnosis Date Noted  . Breast lump in female 04/17/2013  . Smoking 04/17/2013  . Joint pain 04/17/2013    Past Surgical History:  Procedure Laterality Date  . right hand surgery     . right wrist surgery       OB History    Gravida  1   Para      Term      Preterm      AB      Living        SAB      TAB      Ectopic      Multiple      Live Births              Family History  Problem Relation Age of Onset  . Hypertension Father   . Cancer Paternal Grandfather        skin cancer     Social History   Tobacco Use  . Smoking status: Current Every Day Smoker    Packs/day: 0.00    Years: 0.00    Pack years: 0.00    Types: Cigarettes  . Smokeless tobacco: Never Used  . Tobacco comment: about 5 cigarettes a day  Substance Use Topics  . Alcohol use: No  . Drug use: Not on file    Home Medications Prior to Admission medications   Medication Sig Start Date End Date Taking? Authorizing Provider  aspirin-acetaminophen-caffeine (EXCEDRIN MIGRAINE) [phone] MG tablet Take 1  tablet by mouth every 6 (six) hours as needed for headache.    [provider]  doxycycline (VIBRA-TABS) 100 MG tablet Take 1 tablet (100 mg total) by mouth 2 (two) times daily. 01/09/20   Elson Areas, PA-C  ibuprofen (ADVIL,MOTRIN) 200 MG tablet Take 400-600 mg by mouth every 6 (six) hours as needed for moderate pain.    [provider]  methocarbamol (ROBAXIN) 500 MG tablet Take 1 tablet (500 mg total) by mouth every 8 (eight) hours as needed for muscle spasms. 06/07/16   Antony Madura, PA-C  naproxen (NAPROSYN) 500 MG tablet Take 1 tablet (500 mg total) by mouth 2 (two) times daily as needed for mild pain or moderate pain. 06/07/16   Antony Madura, PA-C    Allergies    Patient has no known allergies.  Review of Systems   Review of Systems  Gastrointestinal: Negative for nausea.  All other systems reviewed and are negative.   Physical Exam Updated Vital Signs BP 122/79 (BP Location: Right Arm)   Pulse (!) 112  Temp 98.6 F (37 C) (Oral)   Resp 18   Ht 4\' 9"  (1.448 m)   Wt 45 kg   LMP 12/07/2019   SpO2 100%   Breastfeeding Unknown   BMI 21.47 kg/m   Physical Exam Vitals reviewed.  HENT:     Head: Normocephalic.  Cardiovascular:     Rate and Rhythm: Normal rate.  Pulmonary:     Effort: Pulmonary effort is normal.  Musculoskeletal:        General: Swelling present.     Comments: 6cm area of swelling,  Warm to touch,   Skin:    General: Skin is warm.  Neurological:     General: No focal deficit present.     Mental Status: She is alert.  Psychiatric:        Mood and Affect: Mood normal.     ED Results / Procedures / Treatments   Labs (all labs ordered are listed, but only abnormal results are displayed) Labs Reviewed - No data to display  EKG None  Radiology No results found.  Procedures .09/15/2021Incision and Drainage  Date/Time: 01/09/2020 9:19 PM Performed by: 01/11/2020, PA-C Authorized by: Elson Areas, PA-C   Consent:     Consent obtained:  Verbal   Consent given by:  Patient   Risks discussed:  Bleeding, incomplete drainage, pain and damage to other organs   Alternatives discussed:  No treatment Universal protocol:    Procedure explained and questions answered to patient or proxy's satisfaction: yes     Relevant documents present and verified: yes     Test results available and properly labeled: yes     Imaging studies available: yes     Required blood products, implants, devices, and special equipment available: yes     Site/side marked: yes     Immediately prior to procedure a time out was called: yes     Patient identity confirmed:  Verbally with patient Location:    Type:  Abscess   Size:  6   Location:  Upper extremity   Upper extremity location:  Arm   Arm location:  L lower arm Pre-procedure details:    Skin preparation:  Betadine Anesthesia (see MAR for exact dosages):    Anesthesia method:  Local infiltration   Local anesthetic:  Lidocaine 1% w/o epi Procedure type:    Complexity:  Complex Procedure details:    Needle aspiration: no     Incision types:  Single straight   Incision depth:  Subcutaneous   Scalpel blade:  11   Wound management:  Probed and deloculated, irrigated with saline and extensive cleaning   Drainage:  Purulent   Drainage amount:  Moderate   Wound treatment:  Wound left open Post-procedure details:    Patient tolerance of procedure:  Tolerated well, no immediate complications   (including critical care time)  Medications Ordered in ED Medications  lidocaine (PF) (XYLOCAINE) 1 % injection 5 mL (has no administration in time range)  HYDROcodone-acetaminophen (NORCO/VICODIN) 5-325 MG per tablet 2 tablet (2 tablets Oral Given 01/09/20 1923)    ED Course  I have reviewed the triage vital signs and the nursing notes.  Pertinent labs & imaging results that were available during my care of the patient were reviewed by me and considered in my medical decision  making (see chart for details).    MDM Rules/Calculators/A&P  MDM:  Pt counseled on wound care Rx for doxycycline  Final Clinical Impression(s) / ED Diagnoses Final diagnoses:  Abscess    Rx / DC Orders ED Discharge Orders         Ordered    doxycycline (VIBRA-TABS) 100 MG tablet  2 times daily        01/09/20 2013        An After Visit Summary was printed and given to the patient.    Osie Cheeks 01/09/20 2120    Cathren Laine, MD 01/10/20 1436

## 2020-01-09 NOTE — ED Notes (Signed)
Patient called for vitals x2 with no response and not visible in lobby

## 2020-01-09 NOTE — ED Triage Notes (Signed)
?   Spider biter vs abscess on left forearm, red, swollen, with necrotic places in appearance. Painful, warm to touch

## 2020-06-10 ENCOUNTER — Encounter: Payer: Self-pay | Admitting: Family

## 2020-06-10 NOTE — Progress Notes (Signed)
Patient did not show for appointment.
